# Patient Record
Sex: Male | Born: 1965 | Race: White | Hispanic: No | Marital: Single | Smoking: Current every day smoker
Health system: Southern US, Community
[De-identification: ages and names within clinical notes are randomized; demographics above are authoritative.]

## PROBLEM LIST (undated history)

## (undated) DIAGNOSIS — IMO0001 Reserved for inherently not codable concepts without codable children: Secondary | ICD-10-CM

## (undated) DIAGNOSIS — N429 Disorder of prostate, unspecified: Secondary | ICD-10-CM

## (undated) DIAGNOSIS — J449 Chronic obstructive pulmonary disease, unspecified: Secondary | ICD-10-CM

## (undated) DIAGNOSIS — M1990 Unspecified osteoarthritis, unspecified site: Secondary | ICD-10-CM

## (undated) DIAGNOSIS — I10 Essential (primary) hypertension: Secondary | ICD-10-CM

## (undated) DIAGNOSIS — F419 Anxiety disorder, unspecified: Secondary | ICD-10-CM

## (undated) DIAGNOSIS — F32A Depression, unspecified: Secondary | ICD-10-CM

## (undated) DIAGNOSIS — E039 Hypothyroidism, unspecified: Secondary | ICD-10-CM

## (undated) DIAGNOSIS — F329 Major depressive disorder, single episode, unspecified: Secondary | ICD-10-CM

## (undated) DIAGNOSIS — B1920 Unspecified viral hepatitis C without hepatic coma: Secondary | ICD-10-CM

## (undated) DIAGNOSIS — E7800 Pure hypercholesterolemia, unspecified: Secondary | ICD-10-CM

## (undated) DIAGNOSIS — I252 Old myocardial infarction: Secondary | ICD-10-CM

## (undated) HISTORY — PX: FINGER SURGERY: SHX640

## (undated) HISTORY — PX: HERNIA REPAIR: SHX51

## (undated) HISTORY — PX: KNEE SURGERY: SHX244

## (undated) HISTORY — PX: WRIST SURGERY: SHX841

---

## 2000-05-16 ENCOUNTER — Emergency Department (HOSPITAL_COMMUNITY): Admission: EM | Admit: 2000-05-16 | Discharge: 2000-05-16 | Payer: Self-pay | Admitting: Emergency Medicine

## 2000-12-12 ENCOUNTER — Emergency Department (HOSPITAL_COMMUNITY): Admission: EM | Admit: 2000-12-12 | Discharge: 2000-12-12 | Payer: Self-pay | Admitting: Emergency Medicine

## 2003-08-20 ENCOUNTER — Emergency Department (HOSPITAL_COMMUNITY): Admission: EM | Admit: 2003-08-20 | Discharge: 2003-08-20 | Payer: Self-pay | Admitting: Emergency Medicine

## 2003-08-27 ENCOUNTER — Emergency Department (HOSPITAL_COMMUNITY): Admission: EM | Admit: 2003-08-27 | Discharge: 2003-08-27 | Payer: Self-pay | Admitting: Emergency Medicine

## 2003-08-28 ENCOUNTER — Emergency Department (HOSPITAL_COMMUNITY): Admission: EM | Admit: 2003-08-28 | Discharge: 2003-08-29 | Payer: Self-pay | Admitting: Emergency Medicine

## 2003-08-30 ENCOUNTER — Emergency Department (HOSPITAL_COMMUNITY): Admission: EM | Admit: 2003-08-30 | Discharge: 2003-08-30 | Payer: Self-pay | Admitting: Emergency Medicine

## 2003-09-01 ENCOUNTER — Emergency Department (HOSPITAL_COMMUNITY): Admission: EM | Admit: 2003-09-01 | Discharge: 2003-09-02 | Payer: Self-pay | Admitting: *Deleted

## 2003-09-06 ENCOUNTER — Ambulatory Visit (HOSPITAL_COMMUNITY): Admission: RE | Admit: 2003-09-06 | Discharge: 2003-09-06 | Payer: Self-pay | Admitting: Family Medicine

## 2003-11-11 ENCOUNTER — Emergency Department (HOSPITAL_COMMUNITY): Admission: EM | Admit: 2003-11-11 | Discharge: 2003-11-12 | Payer: Self-pay | Admitting: *Deleted

## 2004-10-23 ENCOUNTER — Emergency Department (HOSPITAL_COMMUNITY): Admission: EM | Admit: 2004-10-23 | Discharge: 2004-10-23 | Payer: Self-pay | Admitting: Emergency Medicine

## 2004-10-25 ENCOUNTER — Emergency Department (HOSPITAL_COMMUNITY): Admission: EM | Admit: 2004-10-25 | Discharge: 2004-10-25 | Payer: Self-pay | Admitting: Emergency Medicine

## 2004-10-27 ENCOUNTER — Emergency Department (HOSPITAL_COMMUNITY): Admission: EM | Admit: 2004-10-27 | Discharge: 2004-10-27 | Payer: Self-pay | Admitting: Emergency Medicine

## 2004-10-28 ENCOUNTER — Ambulatory Visit (HOSPITAL_COMMUNITY): Admission: RE | Admit: 2004-10-28 | Discharge: 2004-10-28 | Payer: Self-pay | Admitting: Emergency Medicine

## 2004-10-31 ENCOUNTER — Emergency Department (HOSPITAL_COMMUNITY): Admission: EM | Admit: 2004-10-31 | Discharge: 2004-10-31 | Payer: Self-pay | Admitting: Emergency Medicine

## 2004-11-01 ENCOUNTER — Emergency Department (HOSPITAL_COMMUNITY): Admission: EM | Admit: 2004-11-01 | Discharge: 2004-11-01 | Payer: Self-pay | Admitting: Emergency Medicine

## 2004-11-05 ENCOUNTER — Emergency Department (HOSPITAL_COMMUNITY): Admission: EM | Admit: 2004-11-05 | Discharge: 2004-11-06 | Payer: Self-pay | Admitting: Emergency Medicine

## 2004-11-12 ENCOUNTER — Emergency Department (HOSPITAL_COMMUNITY): Admission: EM | Admit: 2004-11-12 | Discharge: 2004-11-12 | Payer: Self-pay | Admitting: Emergency Medicine

## 2004-12-07 ENCOUNTER — Emergency Department (HOSPITAL_COMMUNITY): Admission: EM | Admit: 2004-12-07 | Discharge: 2004-12-07 | Payer: Self-pay | Admitting: Emergency Medicine

## 2005-02-05 ENCOUNTER — Emergency Department (HOSPITAL_COMMUNITY): Admission: EM | Admit: 2005-02-05 | Discharge: 2005-02-05 | Payer: Self-pay | Admitting: Emergency Medicine

## 2005-03-08 ENCOUNTER — Emergency Department (HOSPITAL_COMMUNITY): Admission: EM | Admit: 2005-03-08 | Discharge: 2005-03-08 | Payer: Self-pay | Admitting: Emergency Medicine

## 2009-12-17 ENCOUNTER — Emergency Department (HOSPITAL_COMMUNITY)
Admission: EM | Admit: 2009-12-17 | Discharge: 2009-12-17 | Payer: Self-pay | Source: Home / Self Care | Admitting: Emergency Medicine

## 2010-03-31 LAB — DIFFERENTIAL
Eosinophils Absolute: 0.3 10*3/uL (ref 0.0–0.7)
Eosinophils Relative: 4 % (ref 0–5)
Lymphocytes Relative: 22 % (ref 12–46)
Lymphs Abs: 1.7 10*3/uL (ref 0.7–4.0)
Neutro Abs: 5.3 10*3/uL (ref 1.7–7.7)
Neutrophils Relative %: 68 % (ref 43–77)

## 2010-03-31 LAB — COMPREHENSIVE METABOLIC PANEL
ALT: 112 U/L — ABNORMAL HIGH (ref 0–53)
AST: 135 U/L — ABNORMAL HIGH (ref 0–37)
Alkaline Phosphatase: 232 U/L — ABNORMAL HIGH (ref 39–117)
BUN: 7 mg/dL (ref 6–23)
CO2: 24 mEq/L (ref 19–32)
Chloride: 105 mEq/L (ref 96–112)
Creatinine, Ser: 0.99 mg/dL (ref 0.4–1.5)
Sodium: 140 mEq/L (ref 135–145)
Total Bilirubin: 0.8 mg/dL (ref 0.3–1.2)
Total Protein: 7.6 g/dL (ref 6.0–8.3)

## 2010-03-31 LAB — CBC
HCT: 45.8 % (ref 39.0–52.0)
Hemoglobin: 16.7 g/dL (ref 13.0–17.0)
MCH: 31.5 pg (ref 26.0–34.0)
MCHC: 36.5 g/dL — ABNORMAL HIGH (ref 30.0–36.0)
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
## 2010-06-10 DIAGNOSIS — R079 Chest pain, unspecified: Secondary | ICD-10-CM

## 2010-06-10 DIAGNOSIS — R55 Syncope and collapse: Secondary | ICD-10-CM

## 2011-01-06 ENCOUNTER — Encounter: Payer: Self-pay | Admitting: Emergency Medicine

## 2011-01-06 ENCOUNTER — Emergency Department (HOSPITAL_COMMUNITY)
Admission: EM | Admit: 2011-01-06 | Discharge: 2011-01-06 | Disposition: A | Discharge status: Home / Self Care | Payer: Medicaid Other | Attending: Emergency Medicine | Admitting: Emergency Medicine

## 2011-01-06 DIAGNOSIS — T07XXXA Unspecified multiple injuries, initial encounter: Secondary | ICD-10-CM

## 2011-01-06 DIAGNOSIS — Z87828 Personal history of other (healed) physical injury and trauma: Secondary | ICD-10-CM | POA: Insufficient documentation

## 2011-01-06 DIAGNOSIS — M25559 Pain in unspecified hip: Secondary | ICD-10-CM | POA: Insufficient documentation

## 2011-01-06 DIAGNOSIS — M545 Low back pain, unspecified: Secondary | ICD-10-CM | POA: Insufficient documentation

## 2011-01-06 DIAGNOSIS — M25519 Pain in unspecified shoulder: Secondary | ICD-10-CM | POA: Insufficient documentation

## 2011-01-06 DIAGNOSIS — I10 Essential (primary) hypertension: Secondary | ICD-10-CM | POA: Insufficient documentation

## 2011-01-06 DIAGNOSIS — Z760 Encounter for issue of repeat prescription: Secondary | ICD-10-CM | POA: Insufficient documentation

## 2011-01-06 HISTORY — DX: Essential (primary) hypertension: I10

## 2011-01-06 HISTORY — DX: Unspecified viral hepatitis C without hepatic coma: B19.20

## 2011-01-06 LAB — ETHANOL: Alcohol, Ethyl (B): <11 mg/dL (ref 0–11)

## 2011-01-06 LAB — CBC
HCT: 38.8 % — ABNORMAL LOW (ref 39.0–52.0)
MCHC: 35.1 g/dL (ref 30.0–36.0)
Platelets: 185 10*3/uL (ref 150–400)
RBC: 4.57 MIL/uL (ref 4.22–5.81)

## 2011-01-06 LAB — BASIC METABOLIC PANEL
BUN: 8 mg/dL (ref 6–23)
CO2: 31 mEq/L (ref 19–32)
Calcium: 9.5 mg/dL (ref 8.4–10.5)
Creatinine, Ser: 0.62 mg/dL (ref 0.50–1.35)
GFR calc Af Amer: >90 mL/min (ref >90)

## 2011-01-06 MED ORDER — OXYCODONE-ACETAMINOPHEN 5-325 MG PO TABS: 1.0000 | ORAL_TABLET | Freq: Three times a day (TID) | ORAL | Status: AC | PRN
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
## 2011-01-08 NOTE — ED Provider Notes (Signed)
Medical screening examination/treatment/procedure(s) were performed by non-physician practitioner and as supervising physician I was immediately available for consultation/collaboration.  Nicoletta Dress. Colon Branch, MD 01/08/11 [phone]

## 2012-07-03 ENCOUNTER — Ambulatory Visit (HOSPITAL_COMMUNITY)
Admission: RE | Admit: 2012-07-03 | Discharge: 2012-07-03 | Disposition: A | Discharge status: Home / Self Care | Payer: Medicaid Other | Source: Ambulatory Visit | Attending: Family Medicine | Admitting: Family Medicine

## 2012-07-03 ENCOUNTER — Other Ambulatory Visit (HOSPITAL_COMMUNITY): Payer: Self-pay | Admitting: Family Medicine

## 2012-07-03 DIAGNOSIS — M545 Low back pain, unspecified: Secondary | ICD-10-CM | POA: Insufficient documentation

## 2012-07-03 DIAGNOSIS — IMO0002 Reserved for concepts with insufficient information to code with codable children: Secondary | ICD-10-CM

## 2012-07-03 DIAGNOSIS — Q762 Congenital spondylolisthesis: Secondary | ICD-10-CM | POA: Insufficient documentation

## 2014-02-07 ENCOUNTER — Ambulatory Visit (HOSPITAL_COMMUNITY)
Admission: RE | Admit: 2014-02-07 | Discharge: 2014-02-07 | Disposition: A | Discharge status: Home / Self Care | Payer: Medicaid Other | Source: Ambulatory Visit | Attending: Nurse Practitioner | Admitting: Nurse Practitioner

## 2014-02-07 ENCOUNTER — Other Ambulatory Visit (HOSPITAL_COMMUNITY): Payer: Self-pay | Admitting: Nurse Practitioner

## 2014-02-07 DIAGNOSIS — G8929 Other chronic pain: Secondary | ICD-10-CM | POA: Diagnosis not present

## 2014-02-07 DIAGNOSIS — M549 Dorsalgia, unspecified: Secondary | ICD-10-CM | POA: Insufficient documentation

## 2014-02-07 DIAGNOSIS — M545 Low back pain: Secondary | ICD-10-CM | POA: Insufficient documentation

## 2014-04-05 ENCOUNTER — Ambulatory Visit: Payer: Self-pay | Admitting: Surgery

## 2014-04-05 ENCOUNTER — Other Ambulatory Visit: Payer: Self-pay | Admitting: Surgery

## 2014-04-05 DIAGNOSIS — R2230 Localized swelling, mass and lump, unspecified upper limb: Secondary | ICD-10-CM

## 2014-04-05 NOTE — H&P (Signed)
Trevor [name] 04/05/2014 12:11 PM Location: [facility] Patient #: [id number] DOB: Jul 26, 1965 Single / Language: [place] / Race: White Male History of Present Illness Trevor Fus A. Trevor Priola MD; 04/05/2014 12:34 PM) Patient words: mass on left shoulder    pt sent for evaluation of left shoulder cyst/ mass. It has been present for many years and getting larger. Pt has some mild numbness left had but no weakness. Mass causes some shoulder pain.  The patient is a 49 year old male   Other Problems Trevor [name], Trevor [name]; 04/05/2014 12:11 PM) Arthritis Asthma Back Pain Chronic Obstructive Lung Disease Depression Diabetes Mellitus Enlarged Prostate High blood pressure Hypercholesterolemia  Past Surgical History Trevor [name], Trevor [name]; 04/05/2014 12:11 PM) Knee Surgery Left. Spinal Surgery - Neck  Allergies Trevor [name], Trevor [name]; 04/05/2014 12:12 PM) No Known Drug Allergies 04/05/2014  Medication History Trevor [name], Trevor [name]; 04/05/2014 12:14 PM) Nasal Moist (0.65% Solution, Nasal) Active. Hydrocodone-Acetaminophen (10-325MG  Tablet, Oral) Active. Pravastatin Sodium (  Tablet, Oral) Active. CloNIDine HCl (0.2MG  Tablet, Oral) Active. Metoprolol Succinate ER (  Tablet ER 24HR, Oral) Active. Levothyroxine Sodium ( Tablet, Oral) Active. MetFORMIN HCl (  Tablet, Oral) Active. Gabapentin (  Capsule, Oral) Active. Medications Reconciled  Social History Trevor [name], Trevor [name]; 04/05/2014 12:11 PM) Caffeine use Coffee, Tea. Illicit drug use Uses daily. Tobacco use Current every day smoker.  Family History Trevor [name], Trevor [name]; 04/05/2014 12:11 PM) Alcohol Abuse Brother, Father, Mother. Arthritis Father, Mother. Diabetes Mellitus Mother. Heart Disease Father, Mother. Hypertension Brother, Father, Mother.     Review of Systems Trevor [name] Trevor [name]; 04/05/2014 12:11 PM) General Present- Appetite Loss. Not Present- Chills, Fatigue, Fever, Night Sweats,  Weight Gain and Weight Loss. HEENT Present- Hearing Loss. Not Present- Earache, Hoarseness, Nose Bleed, Oral Ulcers, Ringing in the Ears, Seasonal Allergies, Sinus Pain, Sore Throat, Visual Disturbances, Wears glasses/contact lenses and Yellow Eyes. Musculoskeletal Present- Back Pain. Not Present- Joint Pain, Joint Stiffness, Muscle Pain, Muscle Weakness and Swelling of Extremities. Neurological Present- Trouble walking. Not Present- Decreased Memory, Fainting, Headaches, Numbness, Seizures, Tingling, Tremor and Weakness. Psychiatric Present- Bipolar and Depression. Not Present- Anxiety, Change in Sleep Pattern, Fearful and Frequent crying.  Vitals Trevor [name] Trevor [name]; 04/05/2014 12:15 PM) 04/05/2014 12:14 PM Weight: 237 lb Height: 67in Body Surface Area: 2.25 m Body Mass Index: 37.12 kg/m Temp.: 97.52F(Temporal)  Pulse: 80 (Regular)  Resp.: 17 (Unlabored)  BP: 140/76 (Sitting, Left Arm, Standard)     Physical Exam (Trevor Luft A. Trevor Bacha MD; 04/05/2014 12:36 PM)  General Mental Status-Alert. General Appearance-Consistent with stated age. Hydration-Well hydrated. Voice-Normal.  Integumentary Note: 6 cm mass left shoulder semixed but soft lobulated.   Chest and Lung Exam Chest and lung exam reveals -quiet, even and easy respiratory effort with no use of accessory muscles and on auscultation, normal breath sounds, no adventitious sounds and normal vocal resonance. Inspection Chest Wall - Normal. Back - normal.  Cardiovascular Cardiovascular examination reveals -normal heart sounds, regular rate and rhythm with no murmurs and normal pedal pulses bilaterally.  Neurologic Note: left hand ROM normalexcept has weakness with extension of left wrist 3 /5. intrinsic muscles intact. No sensry defects noted. Good radial pulse.   Musculoskeletal Normal Exam - Left-Upper Extremity Strength Normal and Lower Extremity Strength Normal. Normal Exam - Right-Upper  Extremity Strength Normal and Lower Extremity Strength Normal.    Assessment & Plan ([name] MD; 04/05/2014 12:38 PM)  MASS OF SKIN OF LEFT SHOULDER (782.2  R22.32) Impression: large semifixed mass will need MRI to determine if the joint space or  nerves involved. Follow up after once anatomy better determined.  risk of resection include bleeding, infection, joint injury, nerve injury wit functional loss, blood vessel injury and the need for more surgery.  Current Plans Pt Education - POST OP

## 2014-04-25 ENCOUNTER — Ambulatory Visit (HOSPITAL_COMMUNITY)
Admission: RE | Admit: 2014-04-25 | Discharge: 2014-04-25 | Disposition: A | Discharge status: Home / Self Care | Payer: Medicaid Other | Source: Ambulatory Visit | Attending: Nurse Practitioner | Admitting: Nurse Practitioner

## 2014-04-25 ENCOUNTER — Other Ambulatory Visit (HOSPITAL_COMMUNITY): Payer: Self-pay | Admitting: Nurse Practitioner

## 2014-04-25 DIAGNOSIS — R0602 Shortness of breath: Secondary | ICD-10-CM

## 2014-05-09 ENCOUNTER — Other Ambulatory Visit (HOSPITAL_COMMUNITY): Payer: Self-pay | Admitting: Nurse Practitioner

## 2014-05-09 ENCOUNTER — Ambulatory Visit (HOSPITAL_COMMUNITY)
Admission: RE | Admit: 2014-05-09 | Discharge: 2014-05-09 | Disposition: A | Discharge status: Home / Self Care | Payer: Medicaid Other | Source: Ambulatory Visit | Attending: Nurse Practitioner | Admitting: Nurse Practitioner

## 2014-05-09 DIAGNOSIS — M25552 Pain in left hip: Secondary | ICD-10-CM | POA: Diagnosis present

## 2014-06-12 ENCOUNTER — Ambulatory Visit (HOSPITAL_COMMUNITY): Payer: MEDICAID | Attending: Nurse Practitioner

## 2014-06-18 ENCOUNTER — Ambulatory Visit (INDEPENDENT_AMBULATORY_CARE_PROVIDER_SITE_OTHER): Payer: Medicaid Other | Admitting: Urology

## 2014-06-18 DIAGNOSIS — N529 Male erectile dysfunction, unspecified: Secondary | ICD-10-CM | POA: Diagnosis not present

## 2014-06-18 DIAGNOSIS — N400 Enlarged prostate without lower urinary tract symptoms: Secondary | ICD-10-CM | POA: Diagnosis not present

## 2014-06-18 DIAGNOSIS — R3919 Other difficulties with micturition: Secondary | ICD-10-CM | POA: Diagnosis not present

## 2014-06-19 ENCOUNTER — Emergency Department (HOSPITAL_COMMUNITY): Payer: Medicaid Other

## 2014-06-19 ENCOUNTER — Emergency Department (HOSPITAL_COMMUNITY)
Admission: EM | Admit: 2014-06-19 | Discharge: 2014-06-19 | Disposition: A | Discharge status: Home / Self Care | Payer: Medicaid Other | Attending: Emergency Medicine | Admitting: Emergency Medicine

## 2014-06-19 ENCOUNTER — Encounter (HOSPITAL_COMMUNITY): Payer: Self-pay | Admitting: *Deleted

## 2014-06-19 DIAGNOSIS — E860 Dehydration: Secondary | ICD-10-CM

## 2014-06-19 DIAGNOSIS — E780 Pure hypercholesterolemia: Secondary | ICD-10-CM | POA: Diagnosis not present

## 2014-06-19 DIAGNOSIS — I252 Old myocardial infarction: Secondary | ICD-10-CM | POA: Diagnosis not present

## 2014-06-19 DIAGNOSIS — Z720 Tobacco use: Secondary | ICD-10-CM | POA: Diagnosis not present

## 2014-06-19 DIAGNOSIS — R55 Syncope and collapse: Secondary | ICD-10-CM | POA: Diagnosis present

## 2014-06-19 DIAGNOSIS — R5383 Other fatigue: Secondary | ICD-10-CM | POA: Diagnosis not present

## 2014-06-19 DIAGNOSIS — Z8619 Personal history of other infectious and parasitic diseases: Secondary | ICD-10-CM | POA: Diagnosis not present

## 2014-06-19 DIAGNOSIS — Z79899 Other long term (current) drug therapy: Secondary | ICD-10-CM | POA: Insufficient documentation

## 2014-06-19 DIAGNOSIS — E119 Type 2 diabetes mellitus without complications: Secondary | ICD-10-CM | POA: Insufficient documentation

## 2014-06-19 DIAGNOSIS — I10 Essential (primary) hypertension: Secondary | ICD-10-CM | POA: Insufficient documentation

## 2014-06-19 HISTORY — DX: Old myocardial infarction: I25.2

## 2014-06-19 HISTORY — DX: Pure hypercholesterolemia, unspecified: E78.00

## 2014-06-19 LAB — CBC WITH DIFFERENTIAL/PLATELET
Basophils Absolute: 0.1 10*3/uL (ref 0.0–0.1)
Basophils Relative: 1 % (ref 0–1)
EOS ABS: 0.3 10*3/uL (ref 0.0–0.7)
EOS PCT: 3 % (ref 0–5)
HCT: 40.6 % (ref 39.0–52.0)
Hemoglobin: 13.8 g/dL (ref 13.0–17.0)
Lymphocytes Relative: 23 % (ref 12–46)
Lymphs Abs: 2.6 10*3/uL (ref 0.7–4.0)
MCH: 29.2 pg (ref 26.0–34.0)
MCHC: 34 g/dL (ref 30.0–36.0)
MCV: 85.8 fL (ref 78.0–100.0)
MONOS PCT: 7 % (ref 3–12)
Monocytes Absolute: 0.8 10*3/uL (ref 0.1–1.0)
NEUTROS PCT: 66 % (ref 43–77)
Neutro Abs: 7.4 10*3/uL (ref 1.7–7.7)
Platelets: 272 10*3/uL (ref 150–400)
RBC: 4.73 MIL/uL (ref 4.22–5.81)
RDW: 13 % (ref 11.5–15.5)
WBC: 11.1 10*3/uL — AB (ref 4.0–10.5)

## 2014-06-19 LAB — BASIC METABOLIC PANEL
ANION GAP: 10 (ref 5–15)
BUN: 21 mg/dL — AB (ref 6–20)
CALCIUM: 8.3 mg/dL — AB (ref 8.9–10.3)
CO2: 22 mmol/L (ref 22–32)
Chloride: 105 mmol/L (ref 101–111)
Creatinine, Ser: 1.69 mg/dL — ABNORMAL HIGH (ref 0.61–1.24)
GFR calc Af Amer: 53 mL/min — ABNORMAL LOW (ref >60)
GFR calc non Af Amer: 46 mL/min — ABNORMAL LOW (ref >60)
GLUCOSE: 114 mg/dL — AB (ref 65–99)
Potassium: 3.7 mmol/L (ref 3.5–5.1)
Sodium: 137 mmol/L (ref 135–145)

## 2014-06-19 LAB — URINALYSIS, ROUTINE W REFLEX MICROSCOPIC
Bilirubin Urine: NEGATIVE
GLUCOSE, UA: NEGATIVE mg/dL
HGB URINE DIPSTICK: NEGATIVE
Ketones, ur: NEGATIVE mg/dL
Leukocytes, UA: NEGATIVE
Nitrite: NEGATIVE
Protein, ur: NEGATIVE mg/dL
Specific Gravity, Urine: 1.025 (ref 1.005–1.030)
Urobilinogen, UA: 0.2 mg/dL (ref 0.0–1.0)
pH: 5 (ref 5.0–8.0)

## 2014-06-19 LAB — I-STAT CG4 LACTIC ACID, ED: Lactic Acid, Venous: 1.44 mmol/L (ref 0.5–2.0)

## 2014-06-19 LAB — TROPONIN I: Troponin I: <0.03 ng/mL (ref <0.031)

## 2014-06-19 MED ORDER — SODIUM CHLORIDE 0.9 % IV BOLUS (SEPSIS)
1000.0000 mL | Freq: Once | INTRAVENOUS | Status: AC
  Administered 2014-06-19: 1000 mL via INTRAVENOUS

## 2014-06-19 MED ORDER — SODIUM CHLORIDE 0.9 % IV BOLUS (SEPSIS): 500.0000 mL | Freq: Once | INTRAVENOUS | Status: DC

## 2014-06-19 NOTE — ED Provider Notes (Signed)
CSN: [id number]     Arrival date & time 06/19/14  1524 History   First MD Initiated Contact with Patient 06/19/14 1532     Chief Complaint  Patient presents with  . Loss of Consciousness     (Consider location/radiation/quality/duration/timing/severity/associated sxs/prior Treatment) Patient is a 49 y.o. male presenting with syncope. The history is provided by the patient (the pt states he felt dizzy and passed out.   he feels better now).  Loss of Consciousness Episode history:  Single Most recent episode:  Today Timing:  Rare Progression:  Resolved Chronicity:  New Context: not blood draw   Associated symptoms: no chest pain, no headaches and no seizures     Past Medical History  Diagnosis Date  . Diabetes mellitus   . Hypertension   . Hepatitis C   . High cholesterol   . MI, old    Past Surgical History  Procedure Laterality Date  . Wrist surgery    . Knee surgery    . Hernia repair     No family history on file. History  Substance Use Topics  . Smoking status: Current Every Day Smoker -- 0.50 packs/day  . Smokeless tobacco: Not on file  . Alcohol Use: No    Review of Systems  Constitutional: Positive for fatigue. Negative for appetite change.  HENT: Negative for congestion, ear discharge and sinus pressure.   Eyes: Negative for discharge.  Respiratory: Negative for cough.   Cardiovascular: Positive for syncope. Negative for chest pain.  Gastrointestinal: Negative for abdominal pain and diarrhea.  Genitourinary: Negative for frequency and hematuria.  Musculoskeletal: Negative for back pain.  Skin: Negative for rash.  Neurological: Negative for seizures and headaches.  Psychiatric/Behavioral: Negative for hallucinations.      Allergies  Ivp dye  Home Medications   Prior to Admission medications   Medication Sig Start Date End Date Taking? Authorizing Provider  atorvastatin (LIPITOR) 40 MG tablet Take 40 mg by mouth daily.   Yes Historical Provider,  MD  FLUoxetine (PROZAC) 20 MG capsule Take 20 mg by mouth every evening.   Yes Historical Provider, MD  gabapentin (NEURONTIN) 300 MG capsule Take 300 mg by mouth 3 (three) times daily.   Yes Historical Provider, MD  hydrOXYzine (ATARAX/VISTARIL) 25 MG tablet Take 25 mg by mouth at bedtime.   Yes Historical Provider, MD  levothyroxine (SYNTHROID, LEVOTHROID) 25 MCG tablet Take 25 mcg by mouth daily before breakfast.   Yes Historical Provider, MD  lisinopril (PRINIVIL,ZESTRIL) 20 MG tablet Take 20 mg by mouth daily.   Yes Historical Provider, MD  metFORMIN (GLUCOPHAGE-XR) 500 MG 24 hr tablet Take 500 mg by mouth every evening.   Yes Historical Provider, MD  metoprolol succinate (TOPROL-XL) 50 MG 24 hr tablet Take 50 mg by mouth daily. Take with or immediately following a meal.   Yes Historical Provider, MD  sulindac (CLINORIL) 150 MG tablet Take 150 mg by mouth 2 (two) times daily.   Yes Historical Provider, MD   BP 127/90 mmHg  Pulse 58  Temp(Src) 97.5 F (36.4 C)  Resp 18  Ht 5' 7.5" (1.715 m)  Wt 250 lb (113.399 kg)  BMI 38.56 kg/m2  SpO2 100% Physical Exam  Constitutional: He is oriented to person, place, and time. He appears well-developed.  HENT:  Head: Normocephalic.  Eyes: Conjunctivae and EOM are normal. No scleral icterus.  Neck: Neck supple. No thyromegaly present.  Cardiovascular: Normal rate and regular rhythm.  Exam reveals no gallop and no friction  rub.   No murmur heard. Pulmonary/Chest: No stridor. He has no wheezes. He has no rales. He exhibits no tenderness.  Abdominal: He exhibits no distension. There is no tenderness. There is no rebound.  Musculoskeletal: Normal range of motion. He exhibits no edema.  Lymphadenopathy:    He has no cervical adenopathy.  Neurological: He is oriented to person, place, and time. He exhibits normal muscle tone. Coordination normal.  Skin: No rash noted. No erythema.  Psychiatric: He has a normal mood and affect. His behavior is  normal.    ED Course  Procedures (including critical care time) Labs Review Labs Reviewed  CBC WITH DIFFERENTIAL/PLATELET - Abnormal; Notable for the following:    WBC 11.1 (*)    All other components within normal limits  BASIC METABOLIC PANEL - Abnormal; Notable for the following:    Glucose, Bld 114 (*)    BUN 21 (*)    Creatinine, Ser 1.69 (*)    Calcium 8.3 (*)    GFR calc non Af Amer 46 (*)    GFR calc Af Amer 53 (*)    All other components within normal limits  TROPONIN I  URINALYSIS, ROUTINE W REFLEX MICROSCOPIC (NOT AT [facility])  I-STAT CG4 LACTIC ACID, ED    Imaging Review Ct Head Wo Contrast  06/19/2014   CLINICAL DATA:  49 year old male with dizziness and syncope  EXAM: CT HEAD WITHOUT CONTRAST  TECHNIQUE: Contiguous axial images were obtained from the base of the skull through the vertex without intravenous contrast.  COMPARISON:  Prior CT scan of the head 11/14/2010  FINDINGS: Negative for acute intracranial hemorrhage, acute infarction, mass, mass effect, hydrocephalus or midline shift. Gray-white differentiation is preserved throughout. 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 Remote lacunar infarcts in the bilateral basal ganglia. The globes and orbits are symmetric bilaterally. No focal soft tissue abnormality. Partial right mastoid effusion. The left mastoid air cells and visualized paranasal sinuses are otherwise well aerated. No focal calvarial abnormality. Atherosclerotic calcification present in both cavernous carotid arteries.  IMPRESSION: 1. No acute intracranial abnormality. 2. Partial right mastoid effusion. 3. 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   Electronically Signed   By: Malachy MoanHeath  McCullough M.D.   On: 06/19/2014 16:46   Dg Chest Portable 1 View  06/19/2014   CLINICAL DATA:  Syncopal episode, dizziness, low back  pain, chronic shortness of breath, smokers cough, history diabetes, hypertension, MI, hepatitis-C  EXAM: PORTABLE CHEST - 1 VIEW  COMPARISON:  Portable exam 1546 hours compared to 04/25/2014  FINDINGS: Normal heart size, mediastinal contours, and pulmonary vascularity.  Lungs clear.  No pleural effusion or pneumothorax.  No acute osseous findings.  IMPRESSION: No acute abnormalities.   Electronically Signed   By: Ulyses SouthwardMark  Boles M.D.   On: 06/19/2014 15:56     EKG Interpretation None      MDM   Final diagnoses:  Dehydration    syncope    Bethann BerkshireJoseph Tasheba Henson, MD 06/19/14 1851

## 2014-06-19 NOTE — Discharge Instructions (Signed)
Drink plenty of fluids and follow up with your md next week °

## 2014-06-19 NOTE — ED Notes (Signed)
cbg 133 NSR on monitor reported by [facility], still c/o dizziness, c/o lower back pain with hx of lower back pain

## 2014-06-24 ENCOUNTER — Ambulatory Visit (HOSPITAL_COMMUNITY): Admission: RE | Admit: 2014-06-24 | Payer: Medicaid Other | Source: Ambulatory Visit

## 2014-06-28 ENCOUNTER — Other Ambulatory Visit: Payer: Self-pay | Admitting: Physical Medicine and Rehabilitation

## 2014-06-28 DIAGNOSIS — M48061 Spinal stenosis, lumbar region without neurogenic claudication: Secondary | ICD-10-CM

## 2014-07-05 ENCOUNTER — Ambulatory Visit
Admission: RE | Admit: 2014-07-05 | Discharge: 2014-07-05 | Disposition: A | Discharge status: Home / Self Care | Payer: Medicaid Other | Source: Ambulatory Visit | Attending: Physical Medicine and Rehabilitation | Admitting: Physical Medicine and Rehabilitation

## 2014-07-05 DIAGNOSIS — M48061 Spinal stenosis, lumbar region without neurogenic claudication: Secondary | ICD-10-CM

## 2014-07-05 MED ORDER — METHYLPREDNISOLONE ACETATE 40 MG/ML INJ SUSP (RADIOLOG
120.0000 mg | Freq: Once | INTRAMUSCULAR | Status: AC
  Administered 2014-07-05: 120 mg via EPIDURAL

## 2014-07-05 MED ORDER — IOHEXOL 180 MG/ML  SOLN
1.0000 mL | Freq: Once | INTRAMUSCULAR | Status: AC | PRN
  Administered 2014-07-05: 1 mL via EPIDURAL

## 2014-07-05 NOTE — Discharge Instructions (Signed)

## 2014-07-18 ENCOUNTER — Ambulatory Visit (INDEPENDENT_AMBULATORY_CARE_PROVIDER_SITE_OTHER): Payer: Medicaid Other | Admitting: Cardiology

## 2014-07-18 ENCOUNTER — Encounter: Payer: Self-pay | Admitting: *Deleted

## 2014-07-18 VITALS — BP 156/100 | HR 67 | Ht 67.0 in | Wt 247.0 lb

## 2014-07-18 DIAGNOSIS — Z136 Encounter for screening for cardiovascular disorders: Secondary | ICD-10-CM | POA: Diagnosis not present

## 2014-07-18 DIAGNOSIS — R55 Syncope and collapse: Secondary | ICD-10-CM | POA: Diagnosis not present

## 2014-07-18 NOTE — Patient Instructions (Signed)
Your physician recommends that you schedule a follow-up appointment in: 1 month with Dr. Branch  Your physician recommends that you continue on your current medications as directed. Please refer to the Current Medication list given to you today.  Thank you for choosing [facility]!!    

## 2014-07-18 NOTE — Progress Notes (Signed)
Clinical Summary Trevor [name] is a 49 y.o.male seen today as a new patient for the following medical problems.  1. Syncope - first episode 2.5 years while driving, blacked out. He does not recall the details. Does not recall a prodrome. Did not have any recurrent symptoms until 3-4 weeks ago.  - June 19, 2014 seen at [facility] ER. Episode while working on scooter at home. Stood up to walk in house and passed out. Felt lightheaded. No palpitations, no chest pain. Seen in [facility] ER, labs suggested dehydration (Cr 1.7 up from 0.6), BUN 21. EKG reports abnormal lateral T waves however there is arm lead reversal as the cause. Repeat episode 1 week later, but does not remember details. Seen in [facility] ER yesterday for mechanical fall, he specifically denies syncope.  - drinks 1 gallon tea, 20 oz bottles of water x 3.  - recent increase in pain meds, currently taking hydrocodone/ tylenol, hydrocodone dose is 7.5mg       Past Medical History  Diagnosis Date  . Diabetes mellitus   . Hypertension   . Hepatitis C   . High cholesterol   . MI, old      No Known Allergies   Current Outpatient Prescriptions  Medication Sig Dispense Refill  . atorvastatin (LIPITOR) 40 MG tablet Take 40 mg by mouth daily.    Marland Kitchen FLUoxetine (PROZAC) 20 MG capsule Take 20 mg by mouth every evening.    . gabapentin (NEURONTIN) 300 MG capsule Take 300 mg by mouth 3 (three) times daily.    . hydrOXYzine (ATARAX/VISTARIL) 25 MG tablet Take 25 mg by mouth at bedtime.    Marland Kitchen levothyroxine (SYNTHROID, LEVOTHROID) 25 MCG tablet Take 25 mcg by mouth daily before breakfast.    . lisinopril (PRINIVIL,ZESTRIL) 20 MG tablet Take 20 mg by mouth daily.    . metFORMIN (GLUCOPHAGE-XR) 500 MG 24 hr tablet Take 500 mg by mouth every evening.    . metoprolol succinate (TOPROL-XL) 50 MG 24 hr tablet Take 50 mg by mouth daily. Take with or immediately following a meal.    . sulindac (CLINORIL) 150 MG tablet Take 150 mg by mouth 2  (two) times daily.     No current facility-administered medications for this visit.     Past Surgical History  Procedure Laterality Date  . Wrist surgery    . Knee surgery    . Hernia repair       No Known Allergies    Family History  Problem Relation Age of Onset  . Alcohol abuse Father   . Alcohol abuse Mother   . Alcohol abuse Brother   . Arthritis Father   . Arthritis Mother   . Diabetes Mother   . Heart disease Father   . Heart disease Mother   . Hypertension Father   . Hypertension Mother   . Hypertension Brother      Social History Trevor [name] reports that he has been smoking.  He does not have any smokeless tobacco history on file. Trevor [name] reports that he does not drink alcohol.   Review of Systems CONSTITUTIONAL: No weight loss, fever, chills, weakness or fatigue.  HEENT: Eyes: No visual loss, blurred vision, double vision or yellow sclerae.No hearing loss, sneezing, congestion, runny nose or sore throat.  SKIN: No rash or itching.  CARDIOVASCULAR: per HPI  RESPIRATORY: No shortness of breath, cough or sputum.  GASTROINTESTINAL: No anorexia, nausea, vomiting or diarrhea. No abdominal pain or blood.  GENITOURINARY:  No burning on urination, no polyuria NEUROLOGICAL: No headache, dizziness, syncope, paralysis, ataxia, numbness or tingling in the extremities. No change in bowel or bladder control.  MUSCULOSKELETAL: chronic joint pain LYMPHATICS: No enlarged nodes. No history of splenectomy.  PSYCHIATRIC: No history of depression or anxiety.  ENDOCRINOLOGIC: No reports of sweating, cold or heat intolerance. No polyuria or polydipsia.  Marland Kitchen.   Physical Examination Filed Vitals:   07/18/14 0943  BP: 156/100  Pulse: 67   Filed Vitals:   07/18/14 0921  Height: 5\' 7"  (1.702 m)  Weight: 247 lb (112.038 kg)    Gen: resting comfortably, no acute distress HEENT: no scleral icterus, pupils equal round and reactive, no palptable cervical adenopathy,  CV:  RRR, no m/r/g, NO JVD Resp: Clear to auscultation bilaterally GI: abdomen is soft, non-tender, non-distended, normal bowel sounds, no hepatosplenomegaly MSK: extremities are warm, no edema.  Skin: warm, no rash Neuro:  no focal deficits. Pinpoint pupils Psych: appropriate affect   Diagnostic Studies Clinic EKG: SR    Assessment and Plan  1. Syncope - approximately 3 episodes over the last 2.5 years. Most recently episode occurred after standing up from working on a motor scooter at home. Significantly elevated Cr suggests he was dehdydrate at the time. He drinks a gallon of tea of day, explained caffeine is a diuretic and that he is actually dehydrating him. His pupils are pinpoint and he has recent increase in his hydrocodone, I suspect some opiate induced vasodilation may have contributed to the episode - encouraged increased oral intake of noncaffeinated beverages. If symptoms persist will consider more extensive cardiac workup at that time.    F/u 1 month   Antoine PocheJonathan F. Danner Paulding, M.D.

## 2014-07-23 ENCOUNTER — Ambulatory Visit: Payer: Medicaid Other | Admitting: Urology

## 2014-08-05 ENCOUNTER — Observation Stay (HOSPITAL_COMMUNITY): Payer: Medicaid Other

## 2014-08-05 ENCOUNTER — Inpatient Hospital Stay (HOSPITAL_COMMUNITY)
Admission: EM | Admit: 2014-08-05 | Discharge: 2014-08-13 | DRG: 329 | Disposition: A | Discharge status: Home Health | Payer: Medicaid Other | Attending: Surgery | Admitting: Surgery

## 2014-08-05 ENCOUNTER — Observation Stay (HOSPITAL_COMMUNITY): Payer: Medicaid Other | Admitting: Anesthesiology

## 2014-08-05 ENCOUNTER — Encounter (HOSPITAL_COMMUNITY): Payer: Self-pay | Admitting: Radiology

## 2014-08-05 ENCOUNTER — Encounter (HOSPITAL_COMMUNITY): Admission: EM | Disposition: A | Discharge status: Home Health | Payer: Self-pay | Source: Home / Self Care

## 2014-08-05 DIAGNOSIS — K567 Ileus, unspecified: Secondary | ICD-10-CM | POA: Diagnosis not present

## 2014-08-05 DIAGNOSIS — S36439A Laceration of unspecified part of small intestine, initial encounter: Principal | ICD-10-CM | POA: Diagnosis present

## 2014-08-05 DIAGNOSIS — E876 Hypokalemia: Secondary | ICD-10-CM | POA: Diagnosis not present

## 2014-08-05 DIAGNOSIS — I252 Old myocardial infarction: Secondary | ICD-10-CM

## 2014-08-05 DIAGNOSIS — K9189 Other postprocedural complications and disorders of digestive system: Secondary | ICD-10-CM

## 2014-08-05 DIAGNOSIS — B1920 Unspecified viral hepatitis C without hepatic coma: Secondary | ICD-10-CM | POA: Diagnosis present

## 2014-08-05 DIAGNOSIS — S32009A Unspecified fracture of unspecified lumbar vertebra, initial encounter for closed fracture: Secondary | ICD-10-CM | POA: Diagnosis present

## 2014-08-05 DIAGNOSIS — Z79899 Other long term (current) drug therapy: Secondary | ICD-10-CM

## 2014-08-05 DIAGNOSIS — W132XXA Fall from, out of or through roof, initial encounter: Secondary | ICD-10-CM | POA: Diagnosis present

## 2014-08-05 DIAGNOSIS — F17210 Nicotine dependence, cigarettes, uncomplicated: Secondary | ICD-10-CM | POA: Diagnosis present

## 2014-08-05 DIAGNOSIS — Z0189 Encounter for other specified special examinations: Secondary | ICD-10-CM

## 2014-08-05 DIAGNOSIS — W19XXXA Unspecified fall, initial encounter: Secondary | ICD-10-CM | POA: Diagnosis present

## 2014-08-05 DIAGNOSIS — K668 Other specified disorders of peritoneum: Secondary | ICD-10-CM

## 2014-08-05 DIAGNOSIS — S36409A Unspecified injury of unspecified part of small intestine, initial encounter: Secondary | ICD-10-CM | POA: Diagnosis present

## 2014-08-05 DIAGNOSIS — K651 Peritoneal abscess: Secondary | ICD-10-CM | POA: Diagnosis present

## 2014-08-05 DIAGNOSIS — I10 Essential (primary) hypertension: Secondary | ICD-10-CM | POA: Diagnosis present

## 2014-08-05 DIAGNOSIS — Z8249 Family history of ischemic heart disease and other diseases of the circulatory system: Secondary | ICD-10-CM

## 2014-08-05 DIAGNOSIS — S32019A Unspecified fracture of first lumbar vertebra, initial encounter for closed fracture: Secondary | ICD-10-CM | POA: Diagnosis present

## 2014-08-05 DIAGNOSIS — E780 Pure hypercholesterolemia: Secondary | ICD-10-CM | POA: Diagnosis present

## 2014-08-05 DIAGNOSIS — E119 Type 2 diabetes mellitus without complications: Secondary | ICD-10-CM | POA: Diagnosis present

## 2014-08-05 HISTORY — PX: LAPAROTOMY: SHX154

## 2014-08-05 LAB — GLUCOSE, CAPILLARY: Glucose-Capillary: 126 mg/dL — ABNORMAL HIGH (ref 65–99)

## 2014-08-05 SURGERY — LAPAROTOMY, EXPLORATORY
Anesthesia: General | Site: Abdomen

## 2014-08-05 MED ORDER — ALBUMIN HUMAN 5 % IV SOLN
INTRAVENOUS | Status: DC | PRN
  Administered 2014-08-05: 22:00:00 via INTRAVENOUS

## 2014-08-05 MED ORDER — OXYCODONE HCL 5 MG PO TABS: 5.0000 mg | ORAL_TABLET | Freq: Once | ORAL | Status: AC | PRN

## 2014-08-05 MED ORDER — PROPOFOL 10 MG/ML IV BOLUS
INTRAVENOUS | Status: AC
  Filled 2014-08-05: qty 20

## 2014-08-05 MED ORDER — 0.9 % SODIUM CHLORIDE (POUR BTL) OPTIME
TOPICAL | Status: DC | PRN
  Administered 2014-08-05: 4000 mL

## 2014-08-05 MED ORDER — GLYCOPYRROLATE 0.2 MG/ML IJ SOLN
INTRAMUSCULAR | Status: DC | PRN
  Administered 2014-08-05: 0.4 mg via INTRAVENOUS

## 2014-08-05 MED ORDER — GLYCOPYRROLATE 0.2 MG/ML IJ SOLN
INTRAMUSCULAR | Status: AC
  Filled 2014-08-05: qty 2

## 2014-08-05 MED ORDER — ONDANSETRON HCL 4 MG/2ML IJ SOLN
INTRAMUSCULAR | Status: DC | PRN
  Administered 2014-08-05: 4 mg via INTRAVENOUS

## 2014-08-05 MED ORDER — FENTANYL CITRATE (PF) 250 MCG/5ML IJ SOLN
INTRAMUSCULAR | Status: AC
  Filled 2014-08-05: qty 5

## 2014-08-05 MED ORDER — LIDOCAINE HCL (CARDIAC) 20 MG/ML IV SOLN
INTRAVENOUS | Status: DC | PRN
  Administered 2014-08-05: 40 mg via INTRAVENOUS

## 2014-08-05 MED ORDER — MIDAZOLAM HCL 2 MG/2ML IJ SOLN
INTRAMUSCULAR | Status: AC
  Filled 2014-08-05: qty 2

## 2014-08-05 MED ORDER — DIPHENHYDRAMINE HCL 50 MG/ML IJ SOLN
12.5000 mg | Freq: Four times a day (QID) | INTRAMUSCULAR | Status: DC | PRN
  Filled 2014-08-05: qty 0.25

## 2014-08-05 MED ORDER — FENTANYL CITRATE (PF) 100 MCG/2ML IJ SOLN
INTRAMUSCULAR | Status: DC | PRN
  Administered 2014-08-05: 100 ug via INTRAVENOUS
  Administered 2014-08-05: 150 ug via INTRAVENOUS
  Administered 2014-08-05: 100 ug via INTRAVENOUS

## 2014-08-05 MED ORDER — ONDANSETRON HCL 4 MG/2ML IJ SOLN: 4.0000 mg | Freq: Four times a day (QID) | INTRAMUSCULAR | Status: DC | PRN

## 2014-08-05 MED ORDER — SUCCINYLCHOLINE CHLORIDE 20 MG/ML IJ SOLN
INTRAMUSCULAR | Status: DC | PRN
  Administered 2014-08-05: 140 mg via INTRAVENOUS

## 2014-08-05 MED ORDER — DIPHENHYDRAMINE HCL 12.5 MG/5ML PO ELIX
12.5000 mg | ORAL_SOLUTION | Freq: Four times a day (QID) | ORAL | Status: DC | PRN
  Administered 2014-08-11: 12.5 mg via ORAL
  Filled 2014-08-05: qty 10
  Filled 2014-08-05: qty 5

## 2014-08-05 MED ORDER — NEOSTIGMINE METHYLSULFATE 10 MG/10ML IV SOLN
INTRAVENOUS | Status: DC | PRN
  Administered 2014-08-05: 4 mg via INTRAVENOUS

## 2014-08-05 MED ORDER — PROPOFOL 10 MG/ML IV BOLUS
INTRAVENOUS | Status: DC | PRN
  Administered 2014-08-05: 200 mg via INTRAVENOUS

## 2014-08-05 MED ORDER — LABETALOL HCL 5 MG/ML IV SOLN
INTRAVENOUS | Status: AC
  Filled 2014-08-05: qty 4

## 2014-08-05 MED ORDER — SODIUM CHLORIDE 0.9 % IV BOLUS (SEPSIS)
1000.0000 mL | Freq: Once | INTRAVENOUS | Status: AC
  Administered 2014-08-05: 1000 mL via INTRAVENOUS

## 2014-08-05 MED ORDER — HYDROMORPHONE 0.3 MG/ML IV SOLN
INTRAVENOUS | Status: AC
  Filled 2014-08-05: qty 25

## 2014-08-05 MED ORDER — LIDOCAINE HCL (CARDIAC) 20 MG/ML IV SOLN
INTRAVENOUS | Status: AC
  Filled 2014-08-05: qty 5

## 2014-08-05 MED ORDER — OXYCODONE HCL 5 MG/5ML PO SOLN: 5.0000 mg | Freq: Once | ORAL | Status: AC | PRN

## 2014-08-05 MED ORDER — HYDROMORPHONE HCL 1 MG/ML IJ SOLN
INTRAMUSCULAR | Status: AC
  Filled 2014-08-05: qty 1

## 2014-08-05 MED ORDER — ROCURONIUM BROMIDE 50 MG/5ML IV SOLN
INTRAVENOUS | Status: AC
  Filled 2014-08-05: qty 1

## 2014-08-05 MED ORDER — ONDANSETRON HCL 4 MG/2ML IJ SOLN
INTRAMUSCULAR | Status: AC
  Filled 2014-08-05: qty 2

## 2014-08-05 MED ORDER — MORPHINE SULFATE 4 MG/ML IJ SOLN
4.0000 mg | Freq: Once | INTRAMUSCULAR | Status: AC
  Administered 2014-08-05: 4 mg via INTRAVENOUS
  Filled 2014-08-05: qty 1

## 2014-08-05 MED ORDER — SODIUM CHLORIDE 0.9 % IV SOLN
INTRAVENOUS | Status: DC | PRN
  Administered 2014-08-05 (×2): via INTRAVENOUS

## 2014-08-05 MED ORDER — CEFOXITIN SODIUM 2 G IV SOLR
2.0000 g | Freq: Once | INTRAVENOUS | Status: AC
  Administered 2014-08-05: 2 g via INTRAVENOUS
  Filled 2014-08-05: qty 2

## 2014-08-05 MED ORDER — HYDROMORPHONE HCL 1 MG/ML IJ SOLN
0.2500 mg | INTRAMUSCULAR | Status: DC | PRN
  Administered 2014-08-05 – 2014-08-06 (×4): 0.5 mg via INTRAVENOUS

## 2014-08-05 MED ORDER — DEXTROSE 5 % IV SOLN
INTRAVENOUS | Status: AC
  Filled 2014-08-05 (×2): qty 1

## 2014-08-05 MED ORDER — MIDAZOLAM HCL 5 MG/5ML IJ SOLN
INTRAMUSCULAR | Status: DC | PRN
  Administered 2014-08-05: 2 mg via INTRAVENOUS

## 2014-08-05 MED ORDER — NALOXONE HCL 0.4 MG/ML IJ SOLN
0.4000 mg | INTRAMUSCULAR | Status: DC | PRN
  Filled 2014-08-05: qty 1

## 2014-08-05 MED ORDER — SODIUM CHLORIDE 0.9 % IJ SOLN: 9.0000 mL | INTRAMUSCULAR | Status: DC | PRN

## 2014-08-05 MED ORDER — ONDANSETRON HCL 4 MG/2ML IJ SOLN
4.0000 mg | Freq: Four times a day (QID) | INTRAMUSCULAR | Status: DC | PRN
  Filled 2014-08-05: qty 2

## 2014-08-05 MED ORDER — NEOSTIGMINE METHYLSULFATE 10 MG/10ML IV SOLN
INTRAVENOUS | Status: AC
  Filled 2014-08-05: qty 1

## 2014-08-05 MED ORDER — ROCURONIUM BROMIDE 100 MG/10ML IV SOLN
INTRAVENOUS | Status: DC | PRN
  Administered 2014-08-05: 30 mg via INTRAVENOUS
  Administered 2014-08-05: 10 mg via INTRAVENOUS
  Administered 2014-08-05: 20 mg via INTRAVENOUS

## 2014-08-05 MED ORDER — SUCCINYLCHOLINE CHLORIDE 20 MG/ML IJ SOLN
INTRAMUSCULAR | Status: AC
  Filled 2014-08-05: qty 1

## 2014-08-05 MED ORDER — HYDROMORPHONE 0.3 MG/ML IV SOLN
INTRAVENOUS | Status: DC
  Administered 2014-08-06: 1.5 mg via INTRAVENOUS
  Administered 2014-08-06: 0.54 mg via INTRAVENOUS
  Administered 2014-08-06: 01:00:00 via INTRAVENOUS
  Administered 2014-08-06: 3.34 mg via INTRAVENOUS
  Administered 2014-08-07: 1.2 mg via INTRAVENOUS
  Administered 2014-08-07: 13:00:00 via INTRAVENOUS
  Administered 2014-08-07: 0.9 mg via INTRAVENOUS
  Administered 2014-08-07: 1.5 mg via INTRAVENOUS
  Filled 2014-08-05 (×2): qty 25

## 2014-08-05 SURGICAL SUPPLY — 58 items
BLADE SURG ROTATE 9660 (MISCELLANEOUS) ×2 IMPLANT
CANISTER SUCTION 2500CC (MISCELLANEOUS) ×3 IMPLANT
CATH COUDE FOLEY 2W 5CC 16FR (CATHETERS) ×2 IMPLANT
CHLORAPREP W/TINT 26ML (MISCELLANEOUS) ×3 IMPLANT
COVER MAYO STAND STRL (DRAPES) IMPLANT
COVER SURGICAL LIGHT HANDLE (MISCELLANEOUS) ×3 IMPLANT
DRAIN CHANNEL 19F RND (DRAIN) ×2 IMPLANT
DRAPE LAPAROSCOPIC ABDOMINAL (DRAPES) ×3 IMPLANT
DRAPE UTILITY XL STRL (DRAPES) ×6 IMPLANT
DRAPE WARM FLUID 44X44 (DRAPE) ×3 IMPLANT
DRSG OPSITE POSTOP 4X10 (GAUZE/BANDAGES/DRESSINGS) IMPLANT
DRSG OPSITE POSTOP 4X8 (GAUZE/BANDAGES/DRESSINGS) IMPLANT
DRSG VAC ATS MED SENSATRAC (GAUZE/BANDAGES/DRESSINGS) ×2 IMPLANT
ELECT BLADE 6.5 EXT (BLADE) ×2 IMPLANT
ELECT CAUTERY BLADE 6.4 (BLADE) ×6 IMPLANT
ELECT REM PT RETURN 9FT ADLT (ELECTROSURGICAL) ×3
ELECTRODE REM PT RTRN 9FT ADLT (ELECTROSURGICAL) ×1 IMPLANT
EVACUATOR SILICONE 100CC (DRAIN) ×2 IMPLANT
GLOVE BIO SURGEON STRL SZ7 (GLOVE) ×3 IMPLANT
GLOVE BIOGEL PI IND STRL 7.0 (GLOVE) IMPLANT
GLOVE BIOGEL PI IND STRL 7.5 (GLOVE) ×1 IMPLANT
GLOVE BIOGEL PI IND STRL 8 (GLOVE) IMPLANT
GLOVE BIOGEL PI INDICATOR 7.0 (GLOVE) ×2
GLOVE BIOGEL PI INDICATOR 7.5 (GLOVE) ×6
GLOVE BIOGEL PI INDICATOR 8 (GLOVE) ×2
GLOVE ECLIPSE 6.5 STRL STRAW (GLOVE) ×2 IMPLANT
GLOVE ECLIPSE 7.5 STRL STRAW (GLOVE) ×2 IMPLANT
GOWN STRL REUS W/ TWL LRG LVL3 (GOWN DISPOSABLE) ×3 IMPLANT
GOWN STRL REUS W/TWL LRG LVL3 (GOWN DISPOSABLE) ×9
KIT BASIN OR (CUSTOM PROCEDURE TRAY) ×3 IMPLANT
KIT ROOM TURNOVER OR (KITS) ×3 IMPLANT
LIGASURE IMPACT 36 18CM CVD LR (INSTRUMENTS) ×2 IMPLANT
NS IRRIG 1000ML POUR BTL (IV SOLUTION) ×6 IMPLANT
PACK GENERAL/GYN (CUSTOM PROCEDURE TRAY) ×3 IMPLANT
PAD ARMBOARD 7.5X6 YLW CONV (MISCELLANEOUS) ×3 IMPLANT
PAD NEG PRESSURE SENSATRAC (MISCELLANEOUS) ×2 IMPLANT
RELOAD PROXIMATE 75MM BLUE (ENDOMECHANICALS) ×6 IMPLANT
RELOAD STAPLE 75 3.8 BLU REG (ENDOMECHANICALS) IMPLANT
SPECIMEN JAR LARGE (MISCELLANEOUS) ×2 IMPLANT
SPONGE GAUZE 4X4 12PLY STER LF (GAUZE/BANDAGES/DRESSINGS) ×2 IMPLANT
SPONGE LAP 18X18 X RAY DECT (DISPOSABLE) ×2 IMPLANT
STAPLER GUN LINEAR PROX 60 (STAPLE) ×2 IMPLANT
STAPLER PROXIMATE 75MM BLUE (STAPLE) ×2 IMPLANT
STAPLER VISISTAT 35W (STAPLE) ×3 IMPLANT
SUCTION POOLE TIP (SUCTIONS) ×3 IMPLANT
SUT ETHILON 2 0 FS 18 (SUTURE) ×2 IMPLANT
SUT PDS AB 1 TP1 96 (SUTURE) ×6 IMPLANT
SUT SILK 2 0 SH CR/8 (SUTURE) ×5 IMPLANT
SUT SILK 2 0 TIES 10X30 (SUTURE) ×3 IMPLANT
SUT SILK 3 0 SH CR/8 (SUTURE) ×3 IMPLANT
SUT SILK 3 0 TIES 10X30 (SUTURE) ×3 IMPLANT
SUT VIC AB 3-0 SH 18 (SUTURE) IMPLANT
TAPE CLOTH SURG 4X10 WHT LF (GAUZE/BANDAGES/DRESSINGS) ×2 IMPLANT
TOWEL OR 17X26 10 PK STRL BLUE (TOWEL DISPOSABLE) ×3 IMPLANT
TRAY FOLEY CATH 16FRSI W/METER (SET/KITS/TRAYS/PACK) IMPLANT
TUBE CONNECTING 12'X1/4 (SUCTIONS)
TUBE CONNECTING 12X1/4 (SUCTIONS) IMPLANT
YANKAUER SUCT BULB TIP NO VENT (SUCTIONS) ×2 IMPLANT

## 2014-08-05 NOTE — ED Notes (Signed)
Patient states that he had fallen 12 feet off of a roof and landed on his back.  States that he landed on 2 bags of concrete.

## 2014-08-05 NOTE — Anesthesia Procedure Notes (Signed)
Procedure Name: Intubation Date/Time: 08/05/2014 9:50 PM Performed by: Terrence Pizana S Pre-anesthesia Checklist: Patient identified, Timeout performed, Emergency Drugs available, Suction available and Patient being monitored Patient Re-evaluated:Patient Re-evaluated prior to inductionOxygen Delivery Method: Circle system utilized Preoxygenation: Pre-oxygenation with 100% oxygen Intubation Type: IV induction, Rapid sequence and Cricoid Pressure applied Laryngoscope Size: Mac and 4 Grade View: Grade I Tube type: Subglottic suction tube Tube size: 7.5 mm Number of attempts: 1 Airway Equipment and Method: Stylet Placement Confirmation: ETT inserted through vocal cords under direct vision,  positive ETCO2 and breath sounds checked- equal and bilateral Secured at: 22 cm Tube secured with: Tape Dental Injury: Teeth and Oropharynx as per pre-operative assessment

## 2014-08-05 NOTE — H&P (Addendum)
History   Trevor [name] is an 49 y.o. male.   Chief Complaint:  Chief Complaint  Patient presents with  . Fall  Transfer from [facility] for fall with possible free intraperitoneal air  Fall Associated symptoms include abdominal pain (minimal abdominal pain). Pertinent negatives include no chest pain, coughing, headaches, myalgias, nausea, neck pain or vomiting.  This is a 49 yo male with multiple medical problems who presents about 12-13 hours s/p a fall about 12 feet off a ladder.  He landed on his back on a couple of bags of concrete.  No LOC.  Complaining mostly of lower back pain.  He didn't present to [facility] for evaluation until about 5-6 hours s/p accident.  He was evaluated with labs and CT scans of his cervical, thoracic, and lumbar spine only.  The spine cuts showed possible free intraperitoneal and retroperitoneal air and CT scan of the abdomen and pelvis were recommended for further evaluation.  The patient was transferred to [facility] for further evaluation.  He has minimal abdominal tenderness.   Past Medical History  Diagnosis Date  . Diabetes mellitus   . Hypertension   . Hepatitis C   . High cholesterol   . MI, old   Recent evaluation for syncope, felt to be secondary to dehydration  Past Surgical History  Procedure Laterality Date  . Wrist surgery    . Knee surgery    . Hernia repair      Family History  Problem Relation Age of Onset  . Alcohol abuse Father   . Alcohol abuse Mother   . Alcohol abuse Brother   . Arthritis Father   . Arthritis Mother   . Diabetes Mother   . Heart disease Father   . Heart disease Mother   . Hypertension Father   . Hypertension Mother   . Hypertension Brother    Social History:  reports that he has been smoking Cigarettes.  He started smoking about 32 years ago. He has been smoking about 0.50 packs per day. He has never used smokeless tobacco. He reports that he uses illicit drugs (Marijuana). He reports that he does  not drink alcohol.  Allergies  No Known Allergies Patient reports that IV contrast dye put him into renal failure. Home Medications   Prior to Admission medications   Medication Sig Start Date End Date Taking? Authorizing Provider  atorvastatin (LIPITOR) 40 MG tablet Take 40 mg by mouth daily.    Historical Provider, MD  FLUoxetine (PROZAC) 20 MG capsule Take 20 mg by mouth every evening.    Historical Provider, MD  gabapentin (NEURONTIN) 300 MG capsule Take 300 mg by mouth 3 (three) times daily.    Historical Provider, MD  hydrOXYzine (ATARAX/VISTARIL) 25 MG tablet Take 25 mg by mouth at bedtime.    Historical Provider, MD  levothyroxine (SYNTHROID, LEVOTHROID) 25 MCG tablet Take 25 mcg by mouth daily before breakfast.    Historical Provider, MD  lisinopril (PRINIVIL,ZESTRIL) 20 MG tablet Take 20 mg by mouth daily.    Historical Provider, MD  metFORMIN (GLUCOPHAGE-XR) 500 MG 24 hr tablet Take 500 mg by mouth every evening.    Historical Provider, MD  metoprolol succinate (TOPROL-XL) 50 MG 24 hr tablet Take 50 mg by mouth daily. Take with or immediately following a meal.    Historical Provider, MD  sulindac (CLINORIL) 150 MG tablet Take 150 mg by mouth 2 (two) times daily.    Historical Provider, MD     Trauma Course  Labs from [facility] Na 141 K 4.5 Chloride 105 Bicarb 24.4 BUN 120 Cr .92 LFT's WNL except alk phos 151 Lipase 93 WBC 11.8 Hgb 14.8 Hct 43.5 Plat 215  CT C/T/L spine from [facility] 1.  No sign of cervical spine injury 2.  No sign of thoracic spine injury; degenerative disc changes 3.  Left transverse process fracture L1-L2 4.  Mild stenosis L1-2 and L4-5 5.  Several foci of free peritoneal/ retroperitoneal air  Review of Systems  Constitutional: Negative for weight loss.  HENT: Negative for ear discharge, ear pain, hearing loss and tinnitus.   Eyes: Negative for blurred vision, double vision, photophobia and pain.  Respiratory: Negative for cough, sputum  production and shortness of breath.   Cardiovascular: Negative for chest pain.  Gastrointestinal: Positive for abdominal pain (minimal abdominal pain). Negative for nausea and vomiting.  Genitourinary: Negative for dysuria, urgency, frequency and flank pain.  Musculoskeletal: Positive for back pain and falls. Negative for myalgias, joint pain and neck pain.  Neurological: Negative for dizziness, tingling, sensory change, focal weakness, loss of consciousness and headaches.  Endo/Heme/Allergies: Does not bruise/bleed easily.  Psychiatric/Behavioral: Negative for depression, memory loss and substance abuse. The patient is not nervous/anxious.     Blood pressure 149/93, pulse 62, temperature 97.7 F (36.5 C), temperature source Oral, resp. rate 15, SpO2 96 %. Physical Exam  Constitutional: He is oriented to person, place, and time. He appears well-developed and well-nourished.  HENT:  Head: Normocephalic and atraumatic.  Eyes: EOM are normal.  Neck: Normal range of motion. Neck supple.  Cardiovascular: Normal rate and regular rhythm.   Respiratory: Effort normal and breath sounds normal.  GI: Soft. Bowel sounds are normal. There is tenderness (minimal epigastric tenderness; no palpable masses).  Musculoskeletal: Normal range of motion.  Tender lower back  Neurological: He is alert and oriented to person, place, and time.  Skin: Skin is warm and dry.     Assessment/Plan Fall from 12 feet 1.  Left transverse process fractures L1-2 2.  No signs of peritonitis on physical examination  Plan:  Admit to trauma service for pain control Evaluate abdomen with CT chest/ abd/ pelvis - will avoid IV contrast due to patient's history of renal failure after receiving IV contrast   Cayle Thunder K. 08/05/2014, 7:54 PM  Addendum:   CT Chest/ Abd/ Pelvis shows a large amount of free intraperitoneal air, likely coming from a small bowel injury in the midline upper pelvis.  I discussed this with the  patient and his family and recommend immediate exploratory laparotomy with small bowel resection.  The surgical procedure has been discussed with the patient.  Potential risks, benefits, alternative treatments, and expected outcomes have been explained.  All of the patient's questions at this time have been answered.  The likelihood of reaching the patient's treatment goal is good.  The patient understand the proposed surgical procedure and wishes to proceed.   Imogene Burn. Georgette Dover, MD, [facility]  General/ Trauma Surgery  08/05/2014 9:11 PM  Procedures

## 2014-08-05 NOTE — Transfer of Care (Signed)
Immediate Anesthesia Transfer of Care Note  Patient: Trevor MewRobert L Bouza  Procedure(s) Performed: Procedure(s): EXPLORATORY LAPAROTOMY, SMALL BOWEL RESECTION (N/A)  Patient Location: PACU  Anesthesia Type:General  Level of Consciousness: awake, alert  and oriented  Airway & Oxygen Therapy: Patient Spontanous Breathing and Patient connected to face mask oxygen  Post-op Assessment: Report given to RN and Post -op Vital signs reviewed and stable  Post vital signs: Reviewed and stable  Last Vitals:  Filed Vitals:   08/05/14 1938  BP: 149/93  Pulse: 62  Temp: 36.5 C  Resp: 15    Complications: No apparent anesthesia complications

## 2014-08-05 NOTE — Op Note (Signed)
Preop diagnosis: Fall from 12 feet with free intra-abdominal air Postop diagnosis: Perforated small bowel Procedure performed: Exploratory laparotomy, small bowel resection, placement of pelvic drain, placement of abdominal wound VAC 20 cm2 Surgeon:Katora Fini K. Asst.: Dr. Megan Mans Anesthesia: Gen. endotracheal Indications: This is a 49 year old male who is approximately 15 our status post fall from a 12 foot height. He landed on his back. There was no loss of consciousness. He presented to [facility] [place] 2 PM with some lower back pain. He had minimal abdominal pain. They evaluated him with CT scans of his cervical, thoracic, and lumbar spines. He had transverse process fractures in his lumbar spine. Incidentally he was noted to have some small areas of free air in the peritoneum. He was transported to [facility] where he underwent CT scan of the chest abdomen and pelvis. He was found to have a moderate amount of free intraperitoneal air which appeared to be coming from a perforated segment of small bowel in the mid pelvis. Based on these findings, we recommended immediate exploratory laparotomy.  Description of procedure: The patient is brought to the operating room and placed in a supine position on the operating room table. After an adequate level of general anesthesia was obtained a Coude catheter was placed under sterile technique. The patient apparently has some prostatic hypertrophy because the Foley catheter would not pass easily so we had to switch to a coud catheter. This passed easily with good urine return. His abdomen was shaved, prepped with ChloraPrep and draped in sterile fashion. A timeout was taken to ensure the proper patient and proper procedure. We made a lower midline incision. Dissection was carried down to the linea alba which was divided vertically with cautery. We into the peritoneal cavity. There was a moderate amount of cloudy appearing fluid  but no gross purulence or stool. We placed the Balfour retractor. The small bowel was encountered appeared to be healthy. We were able to identify the terminal ileum at the cecum. We ran the small bowel retrograde fashion. In the distal to mid ileum the small bowel seems to be densely adherent down into the pelvis. We identified the sigmoid colon and the small bowel seems to be adherent to the mesentery of the sigmoid colon. We bluntly dissect this apart and this was obviously the area of inflammation and perforation. There was no gross spillage of stool or succus entericus. About 15 cm distal to this perforated area there is another area of small bowel that seems to be mildly inflamed and was bleeding. We ran the remainder of the small bowel proximally and there are no other injuries. The visible portions of the colon were examined and there are no other signs of injury. A palpated the liver which appeared to be fairly nodular and hard. The stomach was soft and I could palpate the nasogastric tube within the stomach. There is no sign of hemoperitoneum. We then resected the small bowel. We divided just proximal to the area of inflammation with a GIA-75 stapler. We divided distal to the second area that was inflamed and bleeding with a GIA-75 stapler. We divided the mesentery with the LigaSure device. A side-to-side anastomosis was created with the GIA-75 stapler. A 3-0 silk suture was used to reinforce the crotch. The staple line was oversewn with 3-0 silk sutures. The mesenteric defect was closed with 2-0 silk sutures. The anastomosis was patent with no sign of leak. We replaced a small back in the abdomen  and packed with the upper portion of the abdomen. We then examined the sigmoid colon. All the thickening and inflammation seems to be in the mesentery of the sigmoid colon and not actually involving the sigmoid colon itself. It appeared that the small bowel had been inflamed and had densely adherent down to the  mesentery and this had been ripped apart whenever he fell. This likely cause free air. We cannot identify any communication with the colon itself. We made decision not to proceed with any resection but instead placed a drain in this area. A 19 [place] Blake drain was brought out through a stab incision in the left lower quadrant. This was sutured in place with a 2-0 nylon suture. We irrigated the abdomen thoroughly until clear. The omentum was placed over the bowel and we closed the fascia with double-stranded #1 PDS suture. A medium VAC sponge was cut to fit the subcutaneous tissues. This was sealed with an occlusive drape and placed 125 mmHg suction. There was a good seal. The patient was then next made about the recovery room in stable condition. All sponge, instrument, and needle counts are correct.  Wilmon ArmsMatthew K. Corliss Skainssuei, MD, [facility]  General/ Trauma Surgery  08/05/2014 11:42 PM

## 2014-08-05 NOTE — ED Provider Notes (Signed)
CSN: [id number]     Arrival date & time 08/05/14  1914 History   First MD Initiated Contact with Patient 08/05/14 1917     Chief Complaint  Patient presents with  . Fall     (Consider location/radiation/quality/duration/timing/severity/associated sxs/prior Treatment) HPI  49 year old male presents as a transfer from [place] after a fall from a roof. Patient states he was about 10-12 feet off the ground and he fell off the roof onto a bag of concrete. He did not lose consciousness or hit his head. He's complaining mostly of low back pain. CT scans of his cervical, thoracic, and lumbar spine were obtained at the outside hospital. Shows L1 and L2 transverse process fractures and also shows concern for free air in the abdomen. Abdominal CT was not done. Patient was transferred here for evaluation by trauma surgery. Patient is currently complaining of pain, mostly in his back but states he does have some in his abdomen. No weakness or numbness in his extremities.  Past Medical History  Diagnosis Date  . Diabetes mellitus   . Hypertension   . Hepatitis C   . High cholesterol   . MI, old    Past Surgical History  Procedure Laterality Date  . Wrist surgery    . Knee surgery    . Hernia repair     Family History  Problem Relation Age of Onset  . Alcohol abuse Father   . Alcohol abuse Mother   . Alcohol abuse Brother   . Arthritis Father   . Arthritis Mother   . Diabetes Mother   . Heart disease Father   . Heart disease Mother   . Hypertension Father   . Hypertension Mother   . Hypertension Brother    History  Substance Use Topics  . Smoking status: Current Every Day Smoker -- 0.50 packs/day    Types: Cigarettes    Start date: 05/21/1982  . Smokeless tobacco: Never Used  . Alcohol Use: No    Review of Systems  Constitutional: Negative for fever.  Gastrointestinal: Positive for abdominal pain. Negative for vomiting.  Musculoskeletal: Positive for back pain. Negative for  neck pain.  Neurological: Negative for weakness and numbness.  All other systems reviewed and are negative.     Allergies  Review of patient's allergies indicates no known allergies.  Home Medications   Prior to Admission medications   Medication Sig Start Date End Date Taking? Authorizing Provider  atorvastatin (LIPITOR) 40 MG tablet Take 40 mg by mouth daily.    Historical Provider, MD  FLUoxetine (PROZAC) 20 MG capsule Take 20 mg by mouth every evening.    Historical Provider, MD  gabapentin (NEURONTIN) 300 MG capsule Take 300 mg by mouth 3 (three) times daily.    Historical Provider, MD  hydrOXYzine (ATARAX/VISTARIL) 25 MG tablet Take 25 mg by mouth at bedtime.    Historical Provider, MD  levothyroxine (SYNTHROID, LEVOTHROID) 25 MCG tablet Take 25 mcg by mouth daily before breakfast.    Historical Provider, MD  lisinopril (PRINIVIL,ZESTRIL) 20 MG tablet Take 20 mg by mouth daily.    Historical Provider, MD  metFORMIN (GLUCOPHAGE-XR) 500 MG 24 hr tablet Take 500 mg by mouth every evening.    Historical Provider, MD  metoprolol succinate (TOPROL-XL) 50 MG 24 hr tablet Take 50 mg by mouth daily. Take with or immediately following a meal.    Historical Provider, MD  sulindac (CLINORIL) 150 MG tablet Take 150 mg by mouth 2 (two) times daily.  Historical Provider, MD   There were no vitals taken for this visit. Physical Exam  Constitutional: He is oriented to person, place, and time. He appears well-developed and well-nourished.  HENT:  Head: Normocephalic and atraumatic.  Right Ear: External ear normal.  Left Ear: External ear normal.  Nose: Nose normal.  Eyes: Right eye exhibits no discharge. Left eye exhibits no discharge.  Neck: Neck supple.  Cardiovascular: Normal rate, regular rhythm, normal heart sounds and intact distal pulses.   Pulmonary/Chest: Effort normal. He exhibits no tenderness.  No chest trauma noted  Abdominal: Soft. There is tenderness (mild). There is no  rigidity, no rebound and no guarding.  Musculoskeletal: He exhibits no edema.       Lumbar back: He exhibits tenderness.  Neurological: He is alert and oriented to person, place, and time.  Skin: Skin is warm and dry.  Nursing note and vitals reviewed.   ED Course  Procedures (including critical care time) Labs Review Labs Reviewed - No data to display  Imaging Review Ct Abdomen Pelvis Wo Contrast  08/05/2014   CLINICAL DATA:  Larey Seat off house this morning with spine fractures impossible bowel rupture.  EXAM: CT CHEST, ABDOMEN AND PELVIS WITHOUT CONTRAST  TECHNIQUE: Multidetector CT imaging of the chest, abdomen and pelvis was performed following the standard protocol without IV contrast.  COMPARISON:  07/17/2014, 05/18/2012 and 09/26/2010  FINDINGS: CT CHEST FINDINGS  Lungs are adequately inflated demonstrate no focal consolidation, effusion or pneumothorax. There is a 3 mm nodular density over the posterior segment of the left upper lobe. Airways are within normal.  The heart is within normal in size. There is subtle calcification over the left anterior descending and right coronary arteries. There is no mediastinal, hilar or axillary adenopathy. Remaining mediastinal structures are within normal. Bones soft tissues are normal.  CT ABDOMEN AND PELVIS FINDINGS  Abdominal images demonstrate a moderate amount of free peritoneal air. The etiology of this free air is likely due to injury to a short segment of small bowel over the midline lower abdomen. There is stranding of the adjacent mesentery and multiple small foci of extraluminal air associated with this short segment of injured small bowel. There appears to be an adjacent small hematoma in the mesentery just inferior/ posterior to this small-bowel measuring 1.3 x 2.8 cm. The edge of this possible small hematoma abuts the adjacent sigmoid colon.  The liver, spleen, gallbladder,, pancreas and adrenal glands are within normal. Kidneys are within  normal. Ureters are normal. Appendix is normal. There is minimal diverticulosis of the colon. There is minimal calcified plaque over the abdominal aorta and iliac arteries.  Pelvic images demonstrate a normal bladder, prostate and rectum. Evidence of patient's known left L1 and L2 transverse process fractures. Degenerative change of the spine and stable grade 1 anterolisthesis of L5 on S1.  IMPRESSION: Moderate free peritoneal air likely due to posttraumatic injury of a short segment of small bowel over the lower midline abdomen with small adjacent mesenteric hematoma.  Known displaced left L1 and L2 transverse process fractures.  No acute findings within the chest.  3 mm nodular density over the posterior aspect of the left upper lobe. Recommend followup chest CT 1 year.  This recommendation follows the consensus statement: Guidelines for Management of Small Pulmonary Nodules Detected on CT Scans: A Statement from the Fleischner Society as published in Radiology 2005; 237:395-400. Online at: DietDisorder.cz.  Minimal diverticulosis of the colon.  Mild atherosclerotic coronary artery disease.  These results  were called by telephone at the time of interpretation on 08/05/2014 at 9:15 pm to Dr. Marcille BlancoMatt Tsuei , who verbally acknowledged these results.   Electronically Signed   By: Elberta Fortisaniel  Boyle M.D.   On: 08/05/2014 21:14   Ct Chest Wo Contrast  08/05/2014   CLINICAL DATA:  Larey SeatFell off house this morning with spine fractures impossible bowel rupture.  EXAM: CT CHEST, ABDOMEN AND PELVIS WITHOUT CONTRAST  TECHNIQUE: Multidetector CT imaging of the chest, abdomen and pelvis was performed following the standard protocol without IV contrast.  COMPARISON:  07/17/2014, 05/18/2012 and 09/26/2010  FINDINGS: CT CHEST FINDINGS  Lungs are adequately inflated demonstrate no focal consolidation, effusion or pneumothorax. There is a 3 mm nodular density over the posterior segment of the left upper  lobe. Airways are within normal.  The heart is within normal in size. There is subtle calcification over the left anterior descending and right coronary arteries. There is no mediastinal, hilar or axillary adenopathy. Remaining mediastinal structures are within normal. Bones soft tissues are normal.  CT ABDOMEN AND PELVIS FINDINGS  Abdominal images demonstrate a moderate amount of free peritoneal air. The etiology of this free air is likely due to injury to a short segment of small bowel over the midline lower abdomen. There is stranding of the adjacent mesentery and multiple small foci of extraluminal air associated with this short segment of injured small bowel. There appears to be an adjacent small hematoma in the mesentery just inferior/ posterior to this small-bowel measuring 1.3 x 2.8 cm. The edge of this possible small hematoma abuts the adjacent sigmoid colon.  The liver, spleen, gallbladder,, pancreas and adrenal glands are within normal. Kidneys are within normal. Ureters are normal. Appendix is normal. There is minimal diverticulosis of the colon. There is minimal calcified plaque over the abdominal aorta and iliac arteries.  Pelvic images demonstrate a normal bladder, prostate and rectum. Evidence of patient's known left L1 and L2 transverse process fractures. Degenerative change of the spine and stable grade 1 anterolisthesis of L5 on S1.  IMPRESSION: Moderate free peritoneal air likely due to posttraumatic injury of a short segment of small bowel over the lower midline abdomen with small adjacent mesenteric hematoma.  Known displaced left L1 and L2 transverse process fractures.  No acute findings within the chest.  3 mm nodular density over the posterior aspect of the left upper lobe. Recommend followup chest CT 1 year.  This recommendation follows the consensus statement: Guidelines for Management of Small Pulmonary Nodules Detected on CT Scans: A Statement from the Fleischner Society as published in  Radiology 2005; 237:395-400. Online at: DietDisorder.[email or web address].[employer].edu/rad/res/Fleischner-nodule.htm.  Minimal diverticulosis of the colon.  Mild atherosclerotic coronary artery disease.  These results were called by telephone at the time of interpretation on 08/05/2014 at 9:15 pm to Dr. Marcille BlancoMatt Tsuei , who verbally acknowledged these results.   Electronically Signed   By: Elberta Fortisaniel  Boyle M.D.   On: 08/05/2014 21:14     EKG Interpretation None      MDM   Final diagnoses:  Closed fracture of transverse process of lumbar vertebra, initial encounter  Pneumoperitoneum    Patient transferred here from outside facility after a fall with lumbar transverse process fractures and pneumoperitoneum seen on the CT of his L-spine. Patient has mild abdominal tenderness but no surgical abdomen. Well-appearing here. Discussed with surgery, they recommend full trauma scans. He is unable to get contrast because last having a contrasted went into renal failure. CT here shows pneumoperitoneum with  likely small bowel injury. Surgery to give anabiotic and take to the operating room.    Pricilla Loveless, MD 08/05/14 [phone]

## 2014-08-05 NOTE — ED Notes (Signed)
Patient is a transfer from [place].  Patient fell off a house this AM and suffered multiple spinal Fractures and a possible ruptured bowel

## 2014-08-05 NOTE — Anesthesia Preprocedure Evaluation (Addendum)
Anesthesia Evaluation  Patient identified by MRN, date of birth, ID band Patient awake    Reviewed: Allergy & Precautions, NPO status , Patient's Chart, lab work & pertinent test results, reviewed documented beta blocker date and time   History of Anesthesia Complications Negative for: history of anesthetic complications  Airway Mallampati: II  TM Distance: >3 FB Neck ROM: Full    Dental  (+) Edentulous Upper, Edentulous Lower   Pulmonary Current Smoker,  breath sounds clear to auscultation        Cardiovascular hypertension, Pt. on medications and Pt. on home beta blockers + Past MI Rhythm:regular Rate:Normal  Pt has no recollection of ever having a heart attack.  Denies chest pains.   Neuro/Psych    GI/Hepatic negative GI ROS, (+)     substance abuse  , Hepatitis -, C  Endo/Other  diabetes, Type 2, Oral Hypoglycemic AgentsMorbid obesity  Renal/GU      Musculoskeletal  (+) Arthritis -, Osteoarthritis,    Abdominal   Peds  Hematology   Anesthesia Other Findings   Reproductive/Obstetrics                         Anesthesia Physical Anesthesia Plan  ASA: III and emergent  Anesthesia Plan: General   Post-op Pain Management:    Induction: Intravenous, Rapid sequence and Cricoid pressure planned  Airway Management Planned: Oral ETT  Additional Equipment:   Intra-op Plan:   Post-operative Plan: Extubation in OR and Possible Post-op intubation/ventilation  Informed Consent: I have reviewed the patients History and Physical, chart, labs and discussed the procedure including the risks, benefits and alternatives for the proposed anesthesia with the patient or authorized representative who has indicated his/her understanding and acceptance.   Dental advisory given and History available from chart only  Plan Discussed with: CRNA, Anesthesiologist and Surgeon  Anesthesia Plan Comments:         Anesthesia Quick Evaluation

## 2014-08-06 ENCOUNTER — Encounter (HOSPITAL_COMMUNITY): Payer: Self-pay | Admitting: Surgery

## 2014-08-06 DIAGNOSIS — Z8249 Family history of ischemic heart disease and other diseases of the circulatory system: Secondary | ICD-10-CM | POA: Diagnosis not present

## 2014-08-06 DIAGNOSIS — K651 Peritoneal abscess: Secondary | ICD-10-CM | POA: Diagnosis present

## 2014-08-06 DIAGNOSIS — S36439A Laceration of unspecified part of small intestine, initial encounter: Secondary | ICD-10-CM | POA: Diagnosis present

## 2014-08-06 DIAGNOSIS — Z79899 Other long term (current) drug therapy: Secondary | ICD-10-CM | POA: Diagnosis not present

## 2014-08-06 DIAGNOSIS — I10 Essential (primary) hypertension: Secondary | ICD-10-CM | POA: Diagnosis present

## 2014-08-06 DIAGNOSIS — S36409A Unspecified injury of unspecified part of small intestine, initial encounter: Secondary | ICD-10-CM | POA: Diagnosis present

## 2014-08-06 DIAGNOSIS — S32019A Unspecified fracture of first lumbar vertebra, initial encounter for closed fracture: Secondary | ICD-10-CM | POA: Diagnosis present

## 2014-08-06 DIAGNOSIS — M545 Low back pain: Secondary | ICD-10-CM | POA: Diagnosis present

## 2014-08-06 DIAGNOSIS — W132XXA Fall from, out of or through roof, initial encounter: Secondary | ICD-10-CM | POA: Diagnosis present

## 2014-08-06 DIAGNOSIS — E876 Hypokalemia: Secondary | ICD-10-CM | POA: Diagnosis not present

## 2014-08-06 DIAGNOSIS — E119 Type 2 diabetes mellitus without complications: Secondary | ICD-10-CM | POA: Diagnosis present

## 2014-08-06 DIAGNOSIS — B1920 Unspecified viral hepatitis C without hepatic coma: Secondary | ICD-10-CM | POA: Diagnosis present

## 2014-08-06 DIAGNOSIS — F17210 Nicotine dependence, cigarettes, uncomplicated: Secondary | ICD-10-CM | POA: Diagnosis present

## 2014-08-06 DIAGNOSIS — W19XXXA Unspecified fall, initial encounter: Secondary | ICD-10-CM | POA: Diagnosis present

## 2014-08-06 DIAGNOSIS — I252 Old myocardial infarction: Secondary | ICD-10-CM | POA: Diagnosis not present

## 2014-08-06 DIAGNOSIS — K567 Ileus, unspecified: Secondary | ICD-10-CM | POA: Diagnosis not present

## 2014-08-06 DIAGNOSIS — E780 Pure hypercholesterolemia: Secondary | ICD-10-CM | POA: Diagnosis present

## 2014-08-06 LAB — COMPREHENSIVE METABOLIC PANEL
ALT: 22 U/L (ref 17–63)
AST: 24 U/L (ref 15–41)
Albumin: 3.8 g/dL (ref 3.5–5.0)
Alkaline Phosphatase: 103 U/L (ref 38–126)
Anion gap: 9 (ref 5–15)
BUN: 7 mg/dL (ref 6–20)
CALCIUM: 7.8 mg/dL — AB (ref 8.9–10.3)
CO2: 22 mmol/L (ref 22–32)
Chloride: 106 mmol/L (ref 101–111)
Creatinine, Ser: 1.13 mg/dL (ref 0.61–1.24)
GFR calc Af Amer: >60 mL/min (ref >60)
GFR calc non Af Amer: >60 mL/min (ref >60)
Glucose, Bld: 194 mg/dL — ABNORMAL HIGH (ref 65–99)
Potassium: 3.8 mmol/L (ref 3.5–5.1)
Sodium: 137 mmol/L (ref 135–145)
Total Bilirubin: 1 mg/dL (ref 0.3–1.2)
Total Protein: 6.6 g/dL (ref 6.5–8.1)

## 2014-08-06 LAB — CBC
HEMATOCRIT: 39.2 % (ref 39.0–52.0)
HEMATOCRIT: 43.5 % (ref 39.0–52.0)
HEMOGLOBIN: 13.4 g/dL (ref 13.0–17.0)
Hemoglobin: 15 g/dL (ref 13.0–17.0)
MCH: 29.2 pg (ref 26.0–34.0)
MCH: 30 pg (ref 26.0–34.0)
MCHC: 34.2 g/dL (ref 30.0–36.0)
MCHC: 34.5 g/dL (ref 30.0–36.0)
MCV: 85.4 fL (ref 78.0–100.0)
MCV: 87 fL (ref 78.0–100.0)
Platelets: 176 10*3/uL (ref 150–400)
Platelets: 191 10*3/uL (ref 150–400)
RBC: 4.59 MIL/uL (ref 4.22–5.81)
RBC: 5 MIL/uL (ref 4.22–5.81)
RDW: 13.5 % (ref 11.5–15.5)
RDW: 13.6 % (ref 11.5–15.5)
WBC: 13.3 10*3/uL — ABNORMAL HIGH (ref 4.0–10.5)
WBC: 14.7 10*3/uL — AB (ref 4.0–10.5)

## 2014-08-06 LAB — CREATININE, SERUM
Creatinine, Ser: 1.15 mg/dL (ref 0.61–1.24)
GFR calc Af Amer: >60 mL/min (ref >60)

## 2014-08-06 LAB — GLUCOSE, CAPILLARY
GLUCOSE-CAPILLARY: 125 mg/dL — AB (ref 65–99)
GLUCOSE-CAPILLARY: 170 mg/dL — AB (ref 65–99)
GLUCOSE-CAPILLARY: 180 mg/dL — AB (ref 65–99)
GLUCOSE-CAPILLARY: 181 mg/dL — AB (ref 65–99)
Glucose-Capillary: 153 mg/dL — ABNORMAL HIGH (ref 65–99)
Glucose-Capillary: 212 mg/dL — ABNORMAL HIGH (ref 65–99)

## 2014-08-06 MED ORDER — TIOTROPIUM BROMIDE MONOHYDRATE 18 MCG IN CAPS
18.0000 ug | ORAL_CAPSULE | Freq: Every day | RESPIRATORY_TRACT | Status: DC
  Administered 2014-08-10 – 2014-08-13 (×4): 18 ug via RESPIRATORY_TRACT
  Filled 2014-08-06 (×4): qty 5

## 2014-08-06 MED ORDER — DEXTROSE 5 % IV SOLN
2.0000 g | Freq: Four times a day (QID) | INTRAVENOUS | Status: AC
  Administered 2014-08-06 – 2014-08-08 (×12): 2 g via INTRAVENOUS
  Filled 2014-08-06 (×12): qty 2

## 2014-08-06 MED ORDER — ENOXAPARIN SODIUM 40 MG/0.4ML ~~LOC~~ SOLN: 40.0000 mg | SUBCUTANEOUS | Status: DC

## 2014-08-06 MED ORDER — INSULIN ASPART 100 UNIT/ML ~~LOC~~ SOLN
0.0000 [IU] | SUBCUTANEOUS | Status: DC
  Administered 2014-08-06 (×2): 2 [IU] via SUBCUTANEOUS
  Administered 2014-08-06 (×2): 3 [IU] via SUBCUTANEOUS
  Administered 2014-08-06: 5 [IU] via SUBCUTANEOUS
  Administered 2014-08-07: 2 [IU] via SUBCUTANEOUS
  Administered 2014-08-07 (×2): 3 [IU] via SUBCUTANEOUS
  Administered 2014-08-07: 2 [IU] via SUBCUTANEOUS
  Administered 2014-08-07: 3 [IU] via SUBCUTANEOUS
  Administered 2014-08-08 – 2014-08-12 (×12): 2 [IU] via SUBCUTANEOUS

## 2014-08-06 MED ORDER — HYDROMORPHONE HCL 1 MG/ML IJ SOLN
INTRAMUSCULAR | Status: AC
  Filled 2014-08-06: qty 1

## 2014-08-06 MED ORDER — SODIUM CHLORIDE 0.9 % IV SOLN
INTRAVENOUS | Status: DC
  Administered 2014-08-06 – 2014-08-11 (×7): via INTRAVENOUS
  Administered 2014-08-12: 1 mL via INTRAVENOUS

## 2014-08-06 MED ORDER — PANTOPRAZOLE SODIUM 40 MG IV SOLR
40.0000 mg | Freq: Every day | INTRAVENOUS | Status: DC
  Administered 2014-08-08 – 2014-08-09 (×3): 40 mg via INTRAVENOUS
  Filled 2014-08-06 (×3): qty 40

## 2014-08-06 MED ORDER — PANTOPRAZOLE SODIUM 40 MG PO TBEC
40.0000 mg | DELAYED_RELEASE_TABLET | Freq: Every day | ORAL | Status: DC
  Administered 2014-08-06 – 2014-08-13 (×6): 40 mg via ORAL
  Filled 2014-08-06 (×6): qty 1

## 2014-08-06 MED ORDER — FLUOXETINE HCL 20 MG PO CAPS
20.0000 mg | ORAL_CAPSULE | Freq: Every evening | ORAL | Status: DC
  Administered 2014-08-06 – 2014-08-12 (×5): 20 mg via ORAL
  Filled 2014-08-06 (×6): qty 1

## 2014-08-06 MED ORDER — METOPROLOL SUCCINATE ER 50 MG PO TB24
50.0000 mg | ORAL_TABLET | Freq: Every day | ORAL | Status: DC
  Administered 2014-08-06 – 2014-08-07 (×2): 50 mg via ORAL
  Filled 2014-08-06 (×2): qty 1

## 2014-08-06 MED ORDER — LEVOTHYROXINE SODIUM 25 MCG PO TABS
25.0000 ug | ORAL_TABLET | Freq: Every day | ORAL | Status: DC
  Administered 2014-08-06 – 2014-08-13 (×7): 25 ug via ORAL
  Filled 2014-08-06 (×8): qty 1

## 2014-08-06 MED ORDER — HYDRALAZINE HCL 20 MG/ML IJ SOLN
INTRAMUSCULAR | Status: AC
  Filled 2014-08-06: qty 1

## 2014-08-06 MED ORDER — ENOXAPARIN SODIUM 40 MG/0.4ML ~~LOC~~ SOLN
40.0000 mg | Freq: Two times a day (BID) | SUBCUTANEOUS | Status: DC
  Administered 2014-08-06 – 2014-08-13 (×15): 40 mg via SUBCUTANEOUS
  Filled 2014-08-06 (×15): qty 0.4

## 2014-08-06 MED ORDER — LISINOPRIL 20 MG PO TABS
20.0000 mg | ORAL_TABLET | Freq: Every day | ORAL | Status: DC
  Administered 2014-08-06 – 2014-08-08 (×3): 20 mg via ORAL
  Filled 2014-08-06 (×3): qty 1

## 2014-08-06 MED ORDER — LABETALOL HCL 5 MG/ML IV SOLN
5.0000 mg | INTRAVENOUS | Status: DC | PRN
  Administered 2014-08-06: 10 mg via INTRAVENOUS

## 2014-08-06 MED ORDER — ONDANSETRON HCL 4 MG/2ML IJ SOLN: 4.0000 mg | Freq: Four times a day (QID) | INTRAMUSCULAR | Status: DC | PRN

## 2014-08-06 MED ORDER — NICOTINE 14 MG/24HR TD PT24
14.0000 mg | MEDICATED_PATCH | Freq: Every day | TRANSDERMAL | Status: DC
  Administered 2014-08-06 – 2014-08-13 (×8): 14 mg via TRANSDERMAL
  Filled 2014-08-06 (×8): qty 1

## 2014-08-06 MED ORDER — HYDRALAZINE HCL 20 MG/ML IJ SOLN
10.0000 mg | Freq: Once | INTRAMUSCULAR | Status: AC
  Administered 2014-08-06: 10 mg via INTRAVENOUS

## 2014-08-06 MED ORDER — ONDANSETRON HCL 4 MG PO TABS: 4.0000 mg | ORAL_TABLET | Freq: Four times a day (QID) | ORAL | Status: DC | PRN

## 2014-08-06 NOTE — Progress Notes (Signed)
Patient ID: Trevor [name], male   DOB: 06/07/1965, 49 y.o.   MRN: [id number]  LOS: 2 days POD#0  Subjective: No unexpected c/o. Denies N/V/flatus.   Objective: Vital signs in last 24 hours: Temp:  [97 F (36.1 C)-98.2 F (36.8 C)] 98.2 F (36.8 C) (07/19 0451) Pulse Rate:  [62-109] 109 (07/19 0451) Resp:  [11-32] 15 (07/19 0451) BP: (141-199)/(80-117) 141/82 mmHg (07/19 0451) SpO2:  [91 %-100 %] 96 % (07/19 0451) Weight:  [113.172 kg (249 lb 8 oz)] 113.172 kg (249 lb 8 oz) (07/19 0118) Last BM Date: 08/05/14   JP: 26138ml/insertion   Laboratory  CBC  Recent Labs  08/06/14 0028 08/06/14 0400  WBC 14.7* 13.3*  HGB 15.0 13.4  HCT 43.5 39.2  PLT 176 191   BMET  Recent Labs  08/06/14 0028 08/06/14 0400  NA  --  137  K  --  3.8  CL  --  106  CO2  --  22  GLUCOSE  --  194*  BUN  --  7  CREATININE 1.15 1.13  CALCIUM  --  7.8*   CBG (last 3)   Recent Labs  08/05/14 2343 08/06/14 0321  GLUCAP 126* 180*    Physical Exam General appearance: alert and no distress Resp: clear to auscultation bilaterally Cardio: Mild tachycardia GI: Soft, absent BS, VAC in place   Assessment/Plan: Fall SB perf s/p ex lap, SBR -- VAC change tomorrow, continues with ileus Lumbar TVP fxs -- PT/OT Multiple medical problems -- Home meds FEN -- VTE -- SCD's, Lovenox (increase for weight) Dispo -- Ileus    Freeman CaldronMichael J. Nyeisha Goodall, PA-C Pager: [phone] General Trauma PA Pager: [phone]  08/06/2014

## 2014-08-06 NOTE — Progress Notes (Signed)
Initial Nutrition Assessment  DOCUMENTATION CODES:   Obesity unspecified  INTERVENTION:   Diet advancement as medically appropriate.  NUTRITION DIAGNOSIS:   Inadequate oral intake related to inability to eat as evidenced by NPO status.  GOAL:   Patient will meet greater than or equal to 90% of their needs  MONITOR:   Diet advancement, Weight trends, Labs, I & O's  REASON FOR ASSESSMENT:   Malnutrition Screening Tool    ASSESSMENT:   49 yo male with multiple medical problems who presents about 12-13 hours s/p a fall about 12 feet off a ladder. He landed on his back on a couple of bags of concrete. The spine cuts showed possible free intraperitoneal and retroperitoneal air. PMH DM, HTN, hep C.  Procedure performed (7/18): Exploratory laparotomy, small bowel resection, placement of pelvic drain, placement of abdominal wound VAC 20 cm2  Pt is currently NPO as pt with ileus. [facility] Pt reports his abdomen is feeling very sore. PTA pt reports eating well with 2-3 meals daily. Usual body weight reported to be 254 lbs. RD to order supplements if po intake is poor once diet advances.   Pt with no observed significant fat or muscle mass loss.   Labs and medications reviewed.   Diet Order:  Diet NPO time specified Except for: Ice Chips, Sips with Meds  Skin:  Wound (see comment) (Incision on abdomen with VAC)  Last BM:  7/18  Height:   Ht Readings from Last 1 Encounters:  08/06/14 5\' 7"  (1.702 m)    Weight:   Wt Readings from Last 1 Encounters:  08/06/14 249 lb 8 oz (113.172 kg)    Ideal Body Weight:  67 kg  Wt Readings from Last 10 Encounters:  08/06/14 249 lb 8 oz (113.172 kg)  07/18/14 247 lb (112.038 kg)  06/19/14 250 lb (113.399 kg)  01/06/11 170 lb (77.111 kg)    BMI:  Body mass index is 39.07 kg/(m^2).  Estimated Nutritional Needs:   Kcal:  2100-2300  Protein:  115-135 grams  Fluid:  2.1- 2.3 L/day  EDUCATION NEEDS:    No education needs identified at this time  Roslyn SmilingStephanie Dyllan Kats, MS, RD, LDN Pager # [phone] After hours/ weekend pager # [phone]

## 2014-08-06 NOTE — Progress Notes (Signed)
Patient asked RN could she go visit a [place] [address] [facility]. Nurse paged doctor who stated patient was not able to leave unit. RN told patient she was not able to leave. Patient left after being instructed not to. RN paged doctor about patient leaving floor

## 2014-08-06 NOTE — Progress Notes (Signed)
Patient ended up voiding, so the catheter was not needed. Will monitor for any more urine retention.

## 2014-08-06 NOTE — Progress Notes (Signed)
D; pt. Pulled out NGT, attempted to put it back, pt vehemently refused  A; notified MD, okay to leave it out.

## 2014-08-06 NOTE — Evaluation (Signed)
Occupational Therapy Evaluation Patient Details Name: Trevor MewRobert L [name] MRN: [id number] DOB: 10/31/1965 Today's Date: 08/06/2014    History of Present Illness     Clinical Impression   Pt admitted with above. Pt independent with ADLs, PTA. Feel pt will benefit from acute OT to increase independence prior to d/c.     Follow Up Recommendations  Home health OT;Supervision - Intermittent    Equipment Recommendations  Tub/shower bench;3 in 1 bedside comode;Other (comment) (AE)    Recommendations for Other Services       Precautions / Restrictions Precautions Precautions: Back;Fall Precaution Comments: educated on back precautions Restrictions Weight Bearing Restrictions: No      Mobility Bed Mobility Overal bed mobility: Needs Assistance Bed Mobility: Sit to Sidelying;Rolling Rolling: Min assist       Sit to sidelying: Max assist General bed mobility comments: cues for technique.   Transfers Overall transfer level: Needs assistance   Transfers: Sit to/from Stand Sit to Stand: Min assist         General transfer comment: assist to boost from bed. Cue for hand placement.    Balance  Used walker for ambulation-Min guard. No LOB in session.                                          ADL Overall ADL's : Needs assistance/impaired                     Lower Body Dressing: Maximal assistance;Sit to/from stand   Toilet Transfer: Min guard;Ambulation;Minimal assistance;RW (Min assist for sit to stand transfer from bed)           Functional mobility during ADLs: Min guard;Rolling walker General ADL Comments: Educated that AE is available for LB ADLs as pt was unable to cross legs today. Educated on what pt could use for toilet aide as he reports difficulty with this at home. Pt NPO with exception to ice.      Vision     Perception     Praxis      Pertinent Vitals/Pain Pain Assessment: 0-10 Pain Score: 10-Worst pain ever Pain  Location: stomach, groin, and back Pain Descriptors / Indicators: Burning;Other (Comment) (pulling) Pain Intervention(s): Monitored during session;Repositioned;Limited activity within patient's tolerance (gave him PCA to use)     Hand Dominance     Extremity/Trunk Assessment Upper Extremity Assessment Upper Extremity Assessment: Overall WFL for tasks assessed   Lower Extremity Assessment Lower Extremity Assessment: Defer to PT evaluation       Communication Communication Communication: No difficulties   Cognition Arousal/Alertness: Awake/alert Behavior During Therapy: WFL for tasks assessed/performed Overall Cognitive Status: Within Functional Limits for tasks assessed                     General Comments       Exercises       Shoulder Instructions      Home Living Family/patient expects to be discharged to:: Private residence Living Arrangements: Parent (helps his mom at home) Available Help at Discharge: Friend(s);Available PRN/intermittently Type of Home: House Home Access: Stairs to enter Entergy CorporationEntrance Stairs-Number of Steps: 4 Entrance Stairs-Rails: None Home Layout: One level     Bathroom Shower/Tub: Tub/shower unit         Home Equipment: Environmental consultantWalker - 2 wheels (unsure if he has 3 in 1)  Prior Functioning/Environment Level of Independence: Independent             OT Diagnosis: Acute pain   OT Problem List: Decreased range of motion;Decreased activity tolerance;Decreased knowledge of use of DME or AE;Decreased knowledge of precautions;Pain   OT Treatment/Interventions: Self-care/ADL training;DME and/or AE instruction;Therapeutic activities;Patient/family education;Balance training    OT Goals(Current goals can be found in the care plan section) Acute Rehab OT Goals Patient Stated Goal: to pee OT Goal Formulation: With patient Time For Goal Achievement: 08/13/14 Potential to Achieve Goals: Good ADL Goals Pt Will Perform Lower Body  Bathing: with set-up;with adaptive equipment;sit to/from stand Pt Will Perform Lower Body Dressing: with set-up;with adaptive equipment;sit to/from stand Pt Will Transfer to Toilet: with supervision;ambulating Pt Will Perform Toileting - Clothing Manipulation and hygiene: with set-up;with adaptive equipment;sit to/from stand Pt Will Perform Tub/Shower Transfer: Tub transfer;with supervision;ambulating;rolling walker (3 in 1 versus tub bench) Additional ADL Goal #1: Pt will perform bed mobility at supervision level as precursor for ADLs.  OT Frequency: Min 2X/week   Barriers to D/C:            Co-evaluation              End of Session Equipment Utilized During Treatment: Gait belt;Rolling walker Nurse Communication: Mobility status;Other (comment) (pain; wanting ice; d/c recommendation)  Activity Tolerance: Patient limited by pain Patient left: in bed;with call bell/phone within reach   Time: 1610-9604 OT Time Calculation (min): 17 min Charges:  OT General Charges $OT Visit: 1 Procedure OT Evaluation $Initial OT Evaluation Tier I: 1 Procedure G-CodesEarlie Raveling OTR/L [phone] 08/06/2014, 4:49 PM

## 2014-08-07 LAB — GLUCOSE, CAPILLARY
GLUCOSE-CAPILLARY: 107 mg/dL — AB (ref 65–99)
Glucose-Capillary: 129 mg/dL — ABNORMAL HIGH (ref 65–99)
Glucose-Capillary: 164 mg/dL — ABNORMAL HIGH (ref 65–99)
Glucose-Capillary: 161 mg/dL — ABNORMAL HIGH (ref 65–99)
Glucose-Capillary: 148 mg/dL — ABNORMAL HIGH (ref 65–99)

## 2014-08-07 MED ORDER — HYDROMORPHONE HCL 1 MG/ML IJ SOLN
0.5000 mg | INTRAMUSCULAR | Status: DC | PRN
  Administered 2014-08-07 – 2014-08-08 (×4): 1 mg via INTRAVENOUS
  Filled 2014-08-07 (×4): qty 1

## 2014-08-07 MED ORDER — HYDRALAZINE HCL 20 MG/ML IJ SOLN
10.0000 mg | Freq: Once | INTRAMUSCULAR | Status: AC
  Administered 2014-08-07: 10 mg via INTRAVENOUS
  Filled 2014-08-07: qty 1

## 2014-08-07 MED ORDER — HYDRALAZINE HCL 20 MG/ML IJ SOLN
5.0000 mg | Freq: Once | INTRAMUSCULAR | Status: AC
  Administered 2014-08-07: 5 mg via INTRAVENOUS
  Filled 2014-08-07: qty 1

## 2014-08-07 NOTE — Evaluation (Signed)
Physical Therapy Evaluation Patient Details Name: Trevor MewRobert L Gonce MRN: [id number] DOB: 08/12/1965 Today's Date: 08/07/2014   History of Present Illness  49 yo male with multiple medical problems who presents about 12-13 hours s/p a fall about 12 feet off a ladder.SB perf s/p ex lap, SBR. Lumbar TVP fxs.  Clinical Impression  Patient demonstrates deficits in functional mobility as indicated below. Will need continued skilled PT to address deficits and maximize function. Will see as indicated and progress as tolerated. Patient able to ambulate with RW despite pain. OF NOTE: Patient with poor ability to take deep breath or use IS. Encouraged coughing with pillow splint and OOB to chair often with staff.    Follow Up Recommendations Home health PT;Supervision/Assistance - 24 hour    Equipment Recommendations  Rolling walker with 5" wheels    Recommendations for Other Services       Precautions / Restrictions Precautions Precautions: Back;Fall (wound vac in place) Precaution Comments: educated on back precautions Restrictions Weight Bearing Restrictions: No      Mobility  Bed Mobility Overal bed mobility: Needs Assistance Bed Mobility: Sit to Sidelying;Rolling Rolling: Min assist       Sit to sidelying: Max assist General bed mobility comments: cues for technique.   Transfers Overall transfer level: Needs assistance Equipment used: Rolling walker (2 wheeled) Transfers: Sit to/from Stand Sit to Stand: Min assist         General transfer comment: assist to boost from bed. Cue for hand placement.  Ambulation/Gait Ambulation/Gait assistance: Min guard Ambulation Distance (Feet): 30 Feet Assistive device: Rolling walker (2 wheeled) Gait Pattern/deviations: Step-through pattern;Decreased stride length;Trunk flexed Gait velocity: decreased Gait velocity interpretation: Below normal speed for age/gender General Gait Details: very cautious with movement  Stairs            Wheelchair Mobility    Modified Rankin (Stroke Patients Only)       Balance     Sitting balance-Leahy Scale: Fair     Standing balance support: Bilateral upper extremity supported Standing balance-Leahy Scale: Poor Standing balance comment: reliance on RW for support                             Pertinent Vitals/Pain Pain Assessment: 0-10 Pain Score: 9  Pain Location: abdominal region Pain Descriptors / Indicators: Burning;Constant;Sore Pain Intervention(s): Limited activity within patient's tolerance;Monitored during session;Repositioned;PCA encouraged    Home Living Family/patient expects to be discharged to:: Private residence Living Arrangements: Parent (helps his mom at home) Available Help at Discharge: Friend(s);Available PRN/intermittently Type of Home: House Home Access: Stairs to enter Entrance Stairs-Rails: None Entrance Stairs-Number of Steps: 4 Home Layout: One level Home Equipment: Walker - 2 wheels (unsure if he has 3 in 1)      Prior Function Level of Independence: Independent               Hand Dominance        Extremity/Trunk Assessment   Upper Extremity Assessment: Overall WFL for tasks assessed           Lower Extremity Assessment: Overall WFL for tasks assessed      Cervical / Trunk Assessment:  (wound Vac in place)  Communication   Communication: No difficulties  Cognition Arousal/Alertness: Awake/alert Behavior During Therapy: WFL for tasks assessed/performed Overall Cognitive Status: Within Functional Limits for tasks assessed  General Comments      Exercises        Assessment/Plan    PT Assessment Patient needs continued PT services  PT Diagnosis Difficulty walking;Acute pain   PT Problem List Decreased strength;Decreased range of motion;Decreased activity tolerance;Decreased balance;Decreased mobility;Pain;Decreased knowledge of precautions;Cardiopulmonary status  limiting activity  PT Treatment Interventions DME instruction;Gait training;Stair training;Functional mobility training;Therapeutic activities;Therapeutic exercise;Balance training;Patient/family education   PT Goals (Current goals can be found in the Care Plan section) Acute Rehab PT Goals Patient Stated Goal: to not hurt so much PT Goal Formulation: With patient Time For Goal Achievement: 08/21/14 Potential to Achieve Goals: Good    Frequency Min 3X/week   Barriers to discharge        Co-evaluation               End of Session Equipment Utilized During Treatment: Oxygen Activity Tolerance: Patient limited by pain Patient left: in chair;with call bell/phone within reach Nurse Communication: Mobility status;Precautions         Time: 2841-3244 PT Time Calculation (min) (ACUTE ONLY): 21 min   Charges:   PT Evaluation $Initial PT Evaluation Tier I: 1 Procedure     PT G CodesFabio Asa 08-11-14, 5:53 PM Charlotte Crumb, PT DPT  [phone]

## 2014-08-07 NOTE — Consult Note (Signed)
WOC wound consult note Reason for Consult: Consult requested to change abd Vac dressing; CCS following for assessment and plan of care. Wound type: Full thickness post-op wound to midline abd Measurement: 12X3X2cm Wound bed: Beefy red Drainage (amount, consistency, odor) Small amt bleeding when dressing changed Periwound: Intact skin surrounding  Dressing procedure/placement/frequency: Applied one piece of black sponge to 125mm cont suction.  Pt tolerated with mod amt discomfort. Plan for bedside nurse to change Q M/W/F.  Discussed plan of care with patient and he verbalized understanding. Please re-consult if further assistance is needed.  Thank-you,  Cammie Mcgeeawn Bowden Boody MSN, RN, CWOCN, EvermanWCN-AP, CNS [phone]

## 2014-08-07 NOTE — Progress Notes (Signed)
Patient ID: Trevor [name], male   DOB: 02/02/1965, 49 y.o.   Roxanna MewMRN: [id number]   LOS: 1 day   POD#1  Subjective: Sore, not getting much sleep, but wants to stay with PCA for now. Denies N/V/flatus.   Objective: Vital signs in last 24 hours: Temp:  [98 F (36.7 C)-98.6 F (37 C)] 98.6 F (37 C) (07/20 0119) Pulse Rate:  [95-115] 99 (07/20 0119) Resp:  [16-27] 19 (07/20 0435) BP: (152-180)/(80-111) 180/111 mmHg (07/20 0330) SpO2:  [90 %-100 %] 100 % (07/20 0435) Last BM Date: 08/05/14   Laboratory Results CBG (last 3)   Recent Labs  08/06/14 2339 08/07/14 0400 08/07/14 0758  GLUCAP 181* 129* 164*    Physical Exam General appearance: alert and no distress Resp: clear to auscultation bilaterally Cardio: regular rate and rhythm GI: Soft, diminished BS, appropriately TTP, VAC in place though RN reports leaking   Assessment/Plan: Fall SB perf s/p ex lap, SBR -- VAC change today, continues with ileus Lumbar TVP fxs -- PT/OT Multiple medical problems -- Home meds, Metformin to restart tomorrow FEN -- No issues VTE -- SCD's, Lovenox Dispo -- Ileus    Trevor CaldronMichael J. Zayon Trulson, PA-C Pager: [phone] General Trauma PA Pager: [phone]  08/07/2014

## 2014-08-07 NOTE — Progress Notes (Signed)
PCA was discontinued.  I wasted 14ml in the sink. Witnessed by Kathyrn SheriffNancy C.

## 2014-08-07 NOTE — Progress Notes (Addendum)
Occupational Therapy Treatment Patient Details Name: Trevor MewRobert L Budney MRN: [id number] DOB: 06/11/1965 Today's Date: 08/07/2014    History of present illness 49 yo male with multiple medical problems who presents about 12-13 hours s/p a fall about 12 feet off a ladder.SB perf s/p ex lap, SBR. Lumbar TVP fxs.   OT comments  Pt progressing towards acute OT goals. Pt limited by 10/10 lower abdominal and back pain and voiding urgency with no/limited output. Pt ambulated 6 feet in-room at min A level with rw to attempt toilet transfer as detailed below. ADL/AE education provided as detailed below. D/c plan remains appropriate.   Follow Up Recommendations  Home health OT;Supervision - Intermittent    Equipment Recommendations  Tub/shower bench;3 in 1 bedside comode;Other (comment)    Recommendations for Other Services      Precautions / Restrictions Precautions Precautions: Back;Fall Precaution Comments: educated on back precautions Restrictions Weight Bearing Restrictions: No       Mobility Bed Mobility               General bed mobility comments: in recliner  Transfers Overall transfer level: Needs assistance Equipment used: Rolling walker (2 wheeled) Transfers: Sit to/from Stand Sit to Stand: Min assist         General transfer comment: assist during power up from recliner and to control speed of decent.     Balance Overall balance assessment: Needs assistance Sitting-balance support: No upper extremity supported;Feet supported Sitting balance-Leahy Scale: Fair     Standing balance support: Bilateral upper extremity supported;During functional activity Standing balance-Leahy Scale: Poor Standing balance comment: rw for support. Pt with wide BOS in standing.                   ADL Overall ADL's : Needs assistance/impaired                                     Functional mobility during ADLs: Min guard;Rolling walker (6 steps) General ADL  Comments: Pt attempted in-room ambulation to BSC at min guard level. Pt ambulated 6 steps before indicating he needed to sit down due to back pain and urine voiding urgency, Reviewed ADL education. Pt has reacher, sock aid, long-handled shoe horn, and long handled sponge. Educated in AE use.       Vision                     Perception     Praxis      Cognition   Behavior During Therapy: WFL for tasks assessed/performed Overall Cognitive Status: Within Functional Limits for tasks assessed                       Extremity/Trunk Assessment               Exercises     Shoulder Instructions       General Comments      Pertinent Vitals/ Pain       Pain Assessment: 0-10 Pain Score: 10-Worst pain ever Pain Location: stomach and back Pain Descriptors / Indicators: Sore Pain Intervention(s): Limited activity within patient's tolerance;Monitored during session;Repositioned;PCA encouraged  Home Living  Prior Functioning/Environment              Frequency Min 2X/week     Progress Toward Goals  OT Goals(current goals can now be found in the care plan section)  Progress towards OT goals: Progressing toward goals  Acute Rehab OT Goals Patient Stated Goal: to pee OT Goal Formulation: With patient Time For Goal Achievement: 08/13/14 Potential to Achieve Goals: Good ADL Goals Pt Will Perform Lower Body Bathing: with set-up;with adaptive equipment;sit to/from stand Pt Will Perform Lower Body Dressing: with set-up;with adaptive equipment;sit to/from stand Pt Will Transfer to Toilet: with supervision;ambulating Pt Will Perform Toileting - Clothing Manipulation and hygiene: with set-up;with adaptive equipment;sit to/from stand Pt Will Perform Tub/Shower Transfer: Tub transfer;with supervision;ambulating;rolling walker Additional ADL Goal #1: Pt will perform bed mobility at supervision level as  precursor for ADLs.  Plan Discharge plan remains appropriate    Co-evaluation                 End of Session Equipment Utilized During Treatment: Gait belt;Rolling walker;Oxygen   Activity Tolerance Patient limited by pain;Other (comment) (urinary urgency)   Patient Left in chair;with call bell/phone within reach;Other (comment) (with PCA within reach)   Nurse Communication Other (comment) (IV status; urinary urgency with no/limited voiding)        Time: 1610-9604 OT Time Calculation (min): 27 min  Charges: OT General Charges $OT Visit: 1 Procedure OT Treatments $Self Care/Home Management : 23-37 mins  Pilar Grammes 08/07/2014, 10:25 AM

## 2014-08-07 NOTE — Progress Notes (Signed)
Noted PT/OT recommending [facility] services. Unfortunately, pt has Medicaid only, and does not have qualifying diagnosis for HHPT/OT.   Medicaid does cover DME needs.  Met with pt; he states he lives with 50 yo mother, who can assist "some", and has a brother and nephew nearby, who can help, if needed.   Will continue to follow progress.    Reinaldo Raddle, RN, BSN  Trauma/Neuro ICU Case Manager [phone]

## 2014-08-08 ENCOUNTER — Encounter (HOSPITAL_COMMUNITY): Payer: Self-pay | Admitting: General Practice

## 2014-08-08 ENCOUNTER — Inpatient Hospital Stay (HOSPITAL_COMMUNITY): Payer: Medicaid Other

## 2014-08-08 DIAGNOSIS — I10 Essential (primary) hypertension: Secondary | ICD-10-CM | POA: Diagnosis present

## 2014-08-08 DIAGNOSIS — E7800 Pure hypercholesterolemia, unspecified: Secondary | ICD-10-CM | POA: Insufficient documentation

## 2014-08-08 LAB — GLUCOSE, CAPILLARY
GLUCOSE-CAPILLARY: 126 mg/dL — AB (ref 65–99)
GLUCOSE-CAPILLARY: 127 mg/dL — AB (ref 65–99)
Glucose-Capillary: 108 mg/dL — ABNORMAL HIGH (ref 65–99)
Glucose-Capillary: 127 mg/dL — ABNORMAL HIGH (ref 65–99)
Glucose-Capillary: 130 mg/dL — ABNORMAL HIGH (ref 65–99)
Glucose-Capillary: 138 mg/dL — ABNORMAL HIGH (ref 65–99)
Glucose-Capillary: 141 mg/dL — ABNORMAL HIGH (ref 65–99)

## 2014-08-08 MED ORDER — HYDROMORPHONE HCL 1 MG/ML IJ SOLN
1.0000 mg | INTRAMUSCULAR | Status: DC | PRN
  Administered 2014-08-08 – 2014-08-09 (×2): 2 mg via INTRAVENOUS
  Administered 2014-08-09: 1 mg via INTRAVENOUS
  Administered 2014-08-09 – 2014-08-10 (×10): 2 mg via INTRAVENOUS
  Administered 2014-08-10: 1 mg via INTRAVENOUS
  Administered 2014-08-11: 2 mg via INTRAVENOUS
  Administered 2014-08-11: 1 mg via INTRAVENOUS
  Administered 2014-08-11 (×2): 2 mg via INTRAVENOUS
  Administered 2014-08-11: 1 mg via INTRAVENOUS
  Administered 2014-08-11 – 2014-08-13 (×8): 2 mg via INTRAVENOUS
  Filled 2014-08-08 (×6): qty 2
  Filled 2014-08-08: qty 1
  Filled 2014-08-08 (×5): qty 2
  Filled 2014-08-08: qty 1
  Filled 2014-08-08: qty 2
  Filled 2014-08-08: qty 1
  Filled 2014-08-08 (×9): qty 2
  Filled 2014-08-08: qty 1
  Filled 2014-08-08 (×2): qty 2

## 2014-08-08 MED ORDER — GABAPENTIN 300 MG PO CAPS
300.0000 mg | ORAL_CAPSULE | Freq: Three times a day (TID) | ORAL | Status: DC
  Administered 2014-08-08 – 2014-08-13 (×11): 300 mg via ORAL
  Filled 2014-08-08 (×12): qty 1

## 2014-08-08 MED ORDER — HYDRALAZINE HCL 20 MG/ML IJ SOLN
10.0000 mg | Freq: Once | INTRAMUSCULAR | Status: AC
  Administered 2014-08-08: 10 mg via INTRAVENOUS
  Filled 2014-08-08: qty 1

## 2014-08-08 MED ORDER — HYDRALAZINE HCL 20 MG/ML IJ SOLN
10.0000 mg | Freq: Four times a day (QID) | INTRAMUSCULAR | Status: DC | PRN
  Administered 2014-08-08 – 2014-08-11 (×2): 10 mg via INTRAVENOUS
  Filled 2014-08-08 (×2): qty 1

## 2014-08-08 MED ORDER — HYDROCHLOROTHIAZIDE 12.5 MG PO CAPS
12.5000 mg | ORAL_CAPSULE | Freq: Every day | ORAL | Status: DC
  Administered 2014-08-08 – 2014-08-13 (×5): 12.5 mg via ORAL
  Filled 2014-08-08 (×5): qty 1

## 2014-08-08 MED ORDER — PANTOPRAZOLE SODIUM 40 MG IV SOLR: 40.0000 mg | INTRAVENOUS | Status: DC

## 2014-08-08 MED ORDER — ATORVASTATIN CALCIUM 40 MG PO TABS
40.0000 mg | ORAL_TABLET | Freq: Every day | ORAL | Status: DC
  Administered 2014-08-08 – 2014-08-13 (×5): 40 mg via ORAL
  Filled 2014-08-08 (×5): qty 1

## 2014-08-08 MED ORDER — LISINOPRIL 40 MG PO TABS
40.0000 mg | ORAL_TABLET | Freq: Every day | ORAL | Status: DC
  Administered 2014-08-10 – 2014-08-13 (×4): 40 mg via ORAL
  Filled 2014-08-08: qty 2
  Filled 2014-08-08: qty 1
  Filled 2014-08-08: qty 2
  Filled 2014-08-08: qty 1

## 2014-08-08 MED ORDER — HYDROMORPHONE HCL 1 MG/ML IJ SOLN
1.0000 mg | INTRAMUSCULAR | Status: DC | PRN
  Administered 2014-08-08 (×3): 1 mg via INTRAVENOUS
  Filled 2014-08-08 (×4): qty 1

## 2014-08-08 MED ORDER — OXYCODONE HCL 5 MG PO TABS
5.0000 mg | ORAL_TABLET | ORAL | Status: DC | PRN
  Administered 2014-08-08 – 2014-08-12 (×8): 15 mg via ORAL
  Filled 2014-08-08 (×8): qty 3

## 2014-08-08 MED ORDER — METOPROLOL TARTRATE 1 MG/ML IV SOLN
10.0000 mg | Freq: Three times a day (TID) | INTRAVENOUS | Status: DC
  Administered 2014-08-08 – 2014-08-13 (×15): 10 mg via INTRAVENOUS
  Filled 2014-08-08 (×15): qty 10

## 2014-08-08 MED ORDER — METFORMIN HCL ER 500 MG PO TB24: 500.0000 mg | ORAL_TABLET | Freq: Every evening | ORAL | Status: DC

## 2014-08-08 MED ORDER — METOPROLOL SUCCINATE ER 100 MG PO TB24
100.0000 mg | ORAL_TABLET | Freq: Every day | ORAL | Status: DC
  Administered 2014-08-08: 100 mg via ORAL
  Filled 2014-08-08: qty 1

## 2014-08-08 NOTE — Progress Notes (Signed)
Patient ID: Roxanna Mew, male   DOB: 1965-08-11, 49 y.o.   MRN: [id number]   LOS: 2 days   POD#2  Subjective: C/o hiccups, eructation. Agreeable to NGT now. Denies N/V for most part. No flatus. Needs pain meds.   Objective: Vital signs in last 24 hours: Temp:  [98.1 F (36.7 C)-98.8 F (37.1 C)] 98.1 F (36.7 C) (07/21 0540) Pulse Rate:  [71-83] 71 (07/21 0540) Resp:  [17-19] 19 (07/21 0540) BP: (166-190)/(109-112) 166/110 mmHg (07/21 0540) SpO2:  [98 %] 98 % (07/21 0540) Last BM Date: 08/05/14   Laboratory Results CBG (last 3)   Recent Labs  08/07/14 2355 08/08/14 0331 08/08/14 0731  GLUCAP 138* 126* 127*    Physical Exam General appearance: alert and no distress Resp: clear to auscultation bilaterally Cardio: regular rate and rhythm GI: Soft, diminished BS   Assessment/Plan: Fall SB perf s/p ex lap, SBR -- Continues with ileus, place NGT Lumbar TVP fxs -- PT/OT HTN -- Increase ACEI and BB, add diuretic Multiple medical problems -- Home meds, Metformin to restart  FEN -- No issues VTE -- SCD's, Lovenox Dispo -- Ileus    Freeman Caldron, PA-C Pager: [phone] General Trauma PA Pager: [phone]  08/08/2014

## 2014-08-08 NOTE — Progress Notes (Signed)
I called Dr. Dwain Sarna about the patient's high blood pressure.  He gave me an order for IV  Hydralizine. B/P has gone down from 190/110 to 166/110. Continue to monitor.

## 2014-08-09 LAB — GLUCOSE, CAPILLARY
Glucose-Capillary: 115 mg/dL — ABNORMAL HIGH (ref 65–99)
Glucose-Capillary: 130 mg/dL — ABNORMAL HIGH (ref 65–99)
Glucose-Capillary: 136 mg/dL — ABNORMAL HIGH (ref 65–99)
Glucose-Capillary: 91 mg/dL (ref 65–99)
Glucose-Capillary: 93 mg/dL (ref 65–99)
Glucose-Capillary: 96 mg/dL (ref 65–99)

## 2014-08-09 NOTE — Anesthesia Postprocedure Evaluation (Signed)
Anesthesia Post Note  Patient: Trevor [name]  Procedure(s) Performed: Procedure(s) (LRB): EXPLORATORY LAPAROTOMY, SMALL BOWEL RESECTION (N/A)  Anesthesia type: General  Patient location: PACU  Post pain: Pain level controlled and Adequate analgesia  Post assessment: Post-op Vital signs reviewed, Patient's Cardiovascular Status Stable, Respiratory Function Stable, Patent Airway and Pain level controlled  Last Vitals:  Filed Vitals:   08/09/14 0654  BP: 142/95  Pulse: 71  Temp: 36.6 C  Resp: 18    Post vital signs: Reviewed and stable  Level of consciousness: awake, alert  and oriented  Complications: No apparent anesthesia complications

## 2014-08-09 NOTE — Progress Notes (Signed)
Physical Therapy Treatment Patient Details Name: Trevor [name] MRN: [id number] DOB: 1965-09-30 Today's Date: 08/09/2014    History of Present Illness 49 yo male with multiple medical problems who presents about 12-13 hours s/p a fall about 12 feet off a ladder.SB perf s/p ex lap, SBR. Lumbar TVP fxs.    PT Comments    Good progress towards functional goals today. Ambulating slowly at a supervision level up to 190 feet, using a rolling walker for support. Safely completed stair training. Still complains of abdominal pain, and having difficulty urinating. Will continue to progress until discharge.  Follow Up Recommendations  Home health PT;Supervision/Assistance - 24 hour     Equipment Recommendations  Rolling walker with 5" wheels    Recommendations for Other Services       Precautions / Restrictions Precautions Precautions: Back;Fall (wound vac in place) Precaution Comments: educated on back precautions for comfort Restrictions Weight Bearing Restrictions: No    Mobility  Bed Mobility Overal bed mobility: Needs Assistance Bed Mobility: Rolling;Sidelying to Sit Rolling: Supervision Sidelying to sit: Supervision       General bed mobility comments: Supervision for safety. VC for log roll technique. Use of rail.  Transfers Overall transfer level: Needs assistance Equipment used: Rolling walker (2 wheeled) Transfers: Sit to/from Stand Sit to Stand: Min guard         General transfer comment: Min guard for safety. VC for hand placement. Performed from bed x2. good control and mechanics with descent. Slow to rise  Ambulation/Gait Ambulation/Gait assistance: Supervision Ambulation Distance (Feet): 190 Feet Assistive device: Rolling walker (2 wheeled) Gait Pattern/deviations: Step-through pattern;Trunk flexed Gait velocity: decreased Gait velocity interpretation: Below normal speed for age/gender General Gait Details: Slow and guarded but steady while using a  rolling walker for support. VC for upright posture. Did not require any rest breaks to complete distance. Assistance to manage lines/leads.   Stairs Stairs: Yes Stairs assistance: Supervision Stair Management: No rails;Step to pattern;Backwards;With walker Number of Stairs: 1 (x2) General stair comments: Educated on safe stair navigation techniques using backwards approach and RW for support. Able to safely perform with supervision. Practiced additonal time and pt states he feels confident when this task. No buckling noted, demos good stability overall.  Wheelchair Mobility    Modified Rankin (Stroke Patients Only)       Balance                                    Cognition Arousal/Alertness: Awake/alert Behavior During Therapy: WFL for tasks assessed/performed Overall Cognitive Status: Within Functional Limits for tasks assessed                      Exercises      General Comments General comments (skin integrity, edema, etc.): States he feels better after ambulating today. Unsuccessful attempt to urinate.      Pertinent Vitals/Pain Pain Assessment: 0-10 Pain Score: 8  Pain Location: abdomen Pain Descriptors / Indicators: Constant Pain Intervention(s): Monitored during session;Repositioned    Home Living                      Prior Function            PT Goals (current goals can now be found in the care plan section) Acute Rehab PT Goals Patient Stated Goal: to not hurt so much PT Goal Formulation: With patient Time For  Goal Achievement: 08/21/14 Potential to Achieve Goals: Good Progress towards PT goals: Progressing toward goals    Frequency  Min 3X/week    PT Plan Current plan remains appropriate    Co-evaluation             End of Session Equipment Utilized During Treatment: Gait belt Activity Tolerance: Patient tolerated treatment well Patient left: in chair;with call bell/phone within reach;with chair alarm  set;Other (comment) (back to intermittent suction)     Time: 1610-9604 PT Time Calculation (min) (ACUTE ONLY): 33 min  Charges:  $Gait Training: 8-22 mins $Therapeutic Activity: 8-22 mins                    G Codes:      Berton Mount 16-Aug-2014, 2:00 PM [facility] [place], [place] [phone]

## 2014-08-09 NOTE — Progress Notes (Signed)
Occupational Therapy Treatment Patient Details Name: Trevor [name] MRN: [id number] DOB: 1965-03-06 Today's Date: 08/09/2014    History of present illness 49 yo male with multiple medical problems who presents about 12-13 hours s/p a fall about 12 feet off a ladder.SB perf s/p ex lap, SBR. Lumbar TVP fxs.   OT comments  This 49 yo male admitted with above presents to acute OT making progress, but needs reinforcement. Will continue to follow to work on BADLs--pt may progress to Mod I prior to D/C.  Follow Up Recommendations  Home health OT;Supervision - Intermittent    Equipment Recommendations  Tub/shower bench;3 in 1 bedside comode;Other (comment)       Precautions / Restrictions Precautions Precautions: Back;Fall (wound vac) Precaution Comments: educated on back precautions for comfort--pt could not recall any of them from prior sessions Restrictions Weight Bearing Restrictions: No       Mobility Bed Mobility Overal bed mobility: Needs Assistance Bed Mobility: Rolling;Sidelying to Sit Rolling: Supervision Sidelying to sit: Supervision       General bed mobility comments: VCs for technique that is better for his back  Transfers Overall transfer level: Needs assistance Equipment used: Rolling walker (2 wheeled) Transfers: Sit to/from Stand Sit to Stand: Supervision         General transfer comment: Min guard for safety. VC for hand placement. Performed from bed x2. good control and mechanics with descent. Slow to rise        ADL Overall ADL's : Needs assistance/impaired                     Lower Body Dressing: Set up;Supervision/safety;Cueing for back precautions;With adaptive equipment;Sit to/from stand Lower Body Dressing Details (indicate cue type and reason): using sock aid Toilet Transfer: Supervision/safety;Ambulation;RW;Comfort height toilet;Cueing for safety Toilet Transfer Details (indicate cue type and reason): legs slightly abducted and  feet externally rotated with one hand on RW and one hand on his thigh                  Vision                 Additional Comments: No change from baseline          Cognition   Behavior During Therapy: WFL for tasks assessed/performed Overall Cognitive Status: Within Functional Limits for tasks assessed       Memory: Decreased recall of precautions                            Pertinent Vitals/ Pain       Pain Assessment: 0-10 Pain Score: 8  Pain Location: abdomen and back Pain Descriptors / Indicators: Aching;Sore;Tightness Pain Intervention(s): Monitored during session;Patient requesting pain meds-RN notified         Frequency Min 2X/week     Progress Toward Goals  OT Goals(current goals can now be found in the care plan section)  Progress towards OT goals: Progressing toward goals (pt has met all goals except for LBB and tub transfers, needs reinforcement to maintain S for all other goals)  Acute Rehab OT Goals Patient Stated Goal: to not hurt so much  Plan Discharge plan remains appropriate       End of Session Equipment Utilized During Treatment: Rolling walker   Activity Tolerance Patient tolerated treatment well (despite pain)   Patient Left in bed;with call bell/phone within reach   Nurse Communication Patient requests pain meds (reported that  it was not quite time and she would bring it as soon as it was, I notified pt)        Time: 1601-1630 OT Time Calculation (min): 29 min  Charges: OT General Charges $OT Visit: 1 Procedure OT Treatments $Self Care/Home Management : 23-37 mins  Trevor [name] [phone] 08/09/2014, 4:39 PM

## 2014-08-09 NOTE — Progress Notes (Signed)
Patient ID: Trevor [name], male   DOB: 1965/02/15, 49 y.o.   MRN: [id number]   LOS: 3 days   POD#3  Subjective: Feels better than yesterday, denies N/V/flatus.   Objective: Vital signs in last 24 hours: Temp:  [97.9 F (36.6 C)-98.3 F (36.8 C)] 97.9 F (36.6 C) (07/22 0654) Pulse Rate:  [71-80] 71 (07/22 0654) Resp:  [16-18] 18 (07/22 0654) BP: (142-186)/(95-107) 142/95 mmHg (07/22 0654) SpO2:  [97 %-100 %] 97 % (07/22 0654) Last BM Date: 08/05/14   NGT: 410ml/24h JP: 28ml/24h   Laboratory  CBG (last 3)   Recent Labs  08/08/14 2334 08/09/14 0322 08/09/14 0733  GLUCAP 130* 130* 115*    Physical Exam General appearance: alert and no distress Resp: clear to auscultation bilaterally Cardio: regular rate and rhythm GI: Soft, +BS, VAC in place   Assessment/Plan: Fall SB perf s/p ex lap, SBR -- Continue NGT Lumbar TVP fxs -- PT/OT HTN -- Elevated Multiple medical problems -- Home meds FEN -- No issues VTE -- SCD's, Lovenox Dispo -- Ileus    Freeman Caldron, PA-C Pager: [phone] General Trauma PA Pager: [phone]  08/09/2014

## 2014-08-09 NOTE — Clinical Social Work Note (Addendum)
CSW received consult for SBIRT, patient states he does not drink alcohol, and only smokes cigarettes.  Patient stated he is looking forward to discharging back home, SBIRT and assessment completed, CSW to sign off, please reconsult if other social work needs arise.  Ervin Knack. Arion Morgan, MSW, Theresia Majors [phone] 08/09/2014 2:16 PM

## 2014-08-09 NOTE — Care Management Note (Signed)
Case Management Note  Patient Details  Name: Trevor [name] MRN: [id number] Date of Birth: Mar 21, 1965  Subjective/Objective:   Pt s/p exploratory lap with small bowel resection on 08/05/14.  PTA, pt independent, resides at home with mother.                   Action/Plan: Pt will need wound VAC for home.  Completed wound VAC insurance authorization forms and faxed to [facility] per protocol.  Pt will need HHRN for wound VAC dressing changes at home; referral to [facility], per pt choice.  Will need order for [facility] for VAC dressing changes prior to dc.  Pt currently with NG tube to suction; will follow progress.    Expected Discharge Date:                  Expected Discharge Plan:  Home w Home Health Services  In-House Referral:     Discharge planning Services  CM Consult  Post Acute Care Choice:    Choice offered to:  Patient  DME Arranged:  Vac DME Agency:  [facility]  HH Arranged:  RN HH Agency:  [facility]  Status of Service:  In process, will continue to follow  Medicare Important Message Given:    Date Medicare IM Given:    Medicare IM give by:    Date Additional Medicare IM Given:    Additional Medicare Important Message give by:     If discussed at Long Length of Stay Meetings, dates discussed:    Additional Comments:  Quintella Baton, RN, BSN  Trauma/Neuro ICU Case Manager [phone]

## 2014-08-09 NOTE — Clinical Social Work Note (Signed)
Clinical Social Work Assessment  Patient Details  Name: Trevor [name] MRN: [id number] Date of Birth: 1965/12/20  Date of referral:  08/09/14               Reason for consult:  Other (Comment Required) (SBIRT)                Permission sought to share information with:    Permission granted to share information::     Name::        Agency::     Relationship::     Contact Information:     Housing/Transportation Living arrangements for the past 2 months:  Single Family Home Source of Information:  Patient Patient Interpreter Needed:  None Criminal Activity/Legal Involvement Pertinent to Current Situation/Hospitalization:  No - Comment as needed Significant Relationships:  Parents Lives with:  Parents Do you feel safe going back to the place where you live?  Yes Need for family participation in patient care:  No (Coment)  Care giving concerns: Patient states he lives with his mother, but he does not have any concerns with needing care.   Social Worker assessment / plan:  CSW met with patient who is a 49 year old male who lives in a house with his mother.  Patient is alert and oriented and on disability.  Patient states he does not drink alcohol, but he does smoke cigarettes.  Patient was able to answer SBIRT questions appropriately, and able to express his feelings.  Patient stated he is looking forward to going back home and will receive home health PT.  Patient stated he was on his room working on the house and then he fell off a ladder on to some concrete.  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  Patient stated he does not have any other questions and he feels like he is making progress to get better.  Patient is hopeful that he will be discharged soon.  Employment status:  Disabled (Comment on whether or not currently receiving Disability) Insurance  information:  Medicaid In [place] PT Recommendations:  Home with [place] / Referral to community resources:     Patient/Family's Response to care:  Patient in agreement to discharging home with home health.  Patient/Family's Understanding of and Emotional Response to Diagnosis, Current Treatment, and Prognosis:  Patient is aware of his diagnosis and current treatment plan.  Emotional Assessment Appearance:  Appears older than stated age Attitude/Demeanor/Rapport:    Affect (typically observed):  Appropriate, Pleasant, Calm Orientation:  Oriented to Self, Oriented to Place, Oriented to  Time, Oriented to Situation Alcohol / Substance use:  Tobacco Use Psych involvement (Current and /or in the community):  No (Comment)  Discharge Needs  Concerns to be addressed:  No discharge needs identified Readmission within the last 30 days:  No Current discharge risk:  None Barriers to Discharge:  No Barriers Identified   Ross Ludwig, LCSWA 08/09/2014, 2:07 PM

## 2014-08-10 ENCOUNTER — Inpatient Hospital Stay (HOSPITAL_COMMUNITY): Payer: Medicaid Other

## 2014-08-10 LAB — GLUCOSE, CAPILLARY
GLUCOSE-CAPILLARY: 96 mg/dL (ref 65–99)
GLUCOSE-CAPILLARY: 96 mg/dL (ref 65–99)
GLUCOSE-CAPILLARY: 95 mg/dL (ref 65–99)
Glucose-Capillary: 107 mg/dL — ABNORMAL HIGH (ref 65–99)
Glucose-Capillary: 103 mg/dL — ABNORMAL HIGH (ref 65–99)

## 2014-08-10 LAB — MAGNESIUM: Magnesium: 1.8 mg/dL (ref 1.7–2.4)

## 2014-08-10 LAB — BASIC METABOLIC PANEL
Anion gap: 8 (ref 5–15)
BUN: 13 mg/dL (ref 6–20)
CHLORIDE: 110 mmol/L (ref 101–111)
CO2: 24 mmol/L (ref 22–32)
CREATININE: 0.85 mg/dL (ref 0.61–1.24)
Calcium: 8.4 mg/dL — ABNORMAL LOW (ref 8.9–10.3)
GFR calc Af Amer: >60 mL/min (ref >60)
GFR calc non Af Amer: >60 mL/min (ref >60)
Glucose, Bld: 114 mg/dL — ABNORMAL HIGH (ref 65–99)
Potassium: 3.3 mmol/L — ABNORMAL LOW (ref 3.5–5.1)
Sodium: 142 mmol/L (ref 135–145)

## 2014-08-10 MED ORDER — POTASSIUM CHLORIDE 10 MEQ/100ML IV SOLN
10.0000 meq | INTRAVENOUS | Status: AC
  Administered 2014-08-10 (×4): 10 meq via INTRAVENOUS
  Filled 2014-08-10 (×4): qty 100

## 2014-08-10 NOTE — Progress Notes (Signed)
Patient ID: Trevor [name], male   DOB: 1965/09/11, 49 y.o.   MRN: [id number]  LOS: 4 days   Subjective: No n/v.  Sore.  No flatus.  Drain 35ml.  NGT out.  Ambulating. BP elevated, but stable.    Objective: Vital signs in last 24 hours: Temp:  [97.7 F (36.5 C)-98.2 F (36.8 C)] 97.7 F (36.5 C) (07/23 0505) Pulse Rate:  [60-78] 60 (07/23 0505) Resp:  [18-20] 19 (07/23 0505) BP: (149-161)/(62-100) 158/100 mmHg (07/23 0505) SpO2:  [93 %-98 %] 96 % (07/23 0505) Last BM Date: 08/05/14  Lab Results:  CBC No results for input(s): WBC, HGB, HCT, PLT in the last 72 hours. BMET  Recent Labs  08/10/14 0324  NA 142  K 3.3*  CL 110  CO2 24  GLUCOSE 114*  BUN 13  CREATININE 0.85  CALCIUM 8.4*    Imaging: Dg Abd Portable 1v  08/08/2014   CLINICAL DATA:  Evaluate NG tube placement.  EXAM: PORTABLE ABDOMEN - 1 VIEW  COMPARISON:  CT 08/05/2014  FINDINGS: NG tube tip and side-port project over the left upper quadrant, likely within the stomach. Multiple gaseous distended loops of bowel are demonstrated. Drainage catheter projects over the pelvis. Lower lumbar spine degenerative changes.  IMPRESSION: NG tube tip and side-port project over the stomach.  Gaseous distended loops of small bowel suggestive of ileus or obstruction.   Electronically Signed   By: Annia Belt M.D.   On: 08/08/2014 12:02     PE: General appearance: alert, cooperative and no distress Resp: clear to auscultation bilaterally Cardio: regular rate and rhythm, S1, S2 normal, no murmur, click, rub or gallop GI: +Bs, abdomen is soft, appropriately tender.  midline wound, VAC in place, serosanguinous output.  NGT with bilious output.  LLQ drain with serosanguinous output.   Extremities: extremities normal, atraumatic, no cyanosis or edema   Patient Active Problem List   Diagnosis Date Noted  . Hypertension   . High cholesterol   . Fall 08/06/2014  . Small intestine injury 08/06/2014  . Closed fracture of  transverse process of lumbar vertebra 08/05/2014  . Syncope 07/18/2014    Assessment/Plan: Fall SB perf s/p ex lap, SBR -- POD#5.  Ileus.  C/w drain.   Lumbar TVP fxs -- PT/OT HTN -- c/w home meds, metoprolol, PRN hydralazine.  Multiple medical problems -- stop metformin until he's taking POs to prevent hypoglycemia.  FEN -- hypokalemia, give x1 now.  IVF VTE -- SCD's, Lovenox Dispo -- Ileus  Ashok Norris, ANP-BC [facility]  08/10/2014 8:18 AM

## 2014-08-10 NOTE — Progress Notes (Signed)
Physical Therapy Treatment Patient Details Name: Trevor [name] MRN: [id number] DOB: May 07, 1965 Today's Date: 08/10/2014    History of Present Illness 49 yo male with multiple medical problems who presents about 12-13 hours s/p a fall about 12 feet off a ladder.SB perf s/p ex lap, SBR. Lumbar TVP fxs.PMhx of DM, HTN, Hepatitis C, MI, wrist surgery,  and knee surgery (did not indicate side for either).    PT Comments    Pt is progressing very well with gait with RW.  Advised him to walk with family during their visits.  RN made aware.  I would like to try to start some walking without RW next session if pt is able.  I do not believe he will need any HHPT f/u as he is progressing so quickly, but he may still need RW-TBD.   PT to follow acutely.   Follow Up Recommendations  No PT follow up     Equipment Recommendations  Rolling walker with 5" wheels;Other (comment) (PT is hopeful to wean from RW prior to d/c, check d/c day)    Recommendations for Other Services   NA     Precautions / Restrictions Precautions Precautions: Back;Fall;Other (comment) (wound vac, NGT) Precaution Comments: Educated on log roll and reverse log roll for into and out of bed to not only help with back, but abdominal pain with transitions as well.     Mobility  Bed Mobility             Sit to sidelying: Supervision General bed mobility comments: Verbal cues for reverse log roll technique. Pt using railing for support.   Transfers Overall transfer level: Needs assistance Equipment used: Rolling walker (2 wheeled) Transfers: Sit to/from Stand Sit to Stand: Modified independent (Device/Increase time)            Ambulation/Gait Ambulation/Gait assistance: Supervision Ambulation Distance (Feet): 500 Feet Assistive device: Rolling walker (2 wheeled) Gait Pattern/deviations: Step-through pattern;Staggering left;Staggering right;Trunk flexed Gait velocity: decreased Gait velocity interpretation:  Below normal speed for age/gender General Gait Details: slow gait speed with flexed trunk. Adjusted RW up for his height and to help with upright posture.  Encouraged upright posture to avoid scaring and decreased ROM in the future.            Balance Overall balance assessment: Needs assistance Sitting-balance support: Feet supported;No upper extremity supported Sitting balance-Leahy Scale: Good     Standing balance support: Bilateral upper extremity supported Standing balance-Leahy Scale: Fair                      Cognition Arousal/Alertness: Awake/alert Behavior During Therapy: WFL for tasks assessed/performed Overall Cognitive Status: Within Functional Limits for tasks assessed                         General Comments General comments (skin integrity, edema, etc.): I advised pt that he could walk with family during their visits.  RN notified.       Pertinent Vitals/Pain Pain Assessment: 0-10 Pain Score: 8  Pain Location: abdomen Pain Descriptors / Indicators: Aching Pain Intervention(s): Limited activity within patient's tolerance;Monitored during session;Premedicated before session;Repositioned           PT Goals (current goals can now be found in the care plan section) Acute Rehab PT Goals Patient Stated Goal: to not hurt so much Progress towards PT goals: Progressing toward goals    Frequency  Min 3X/week    PT Plan Discharge  plan needs to be updated       End of Session   Activity Tolerance: Patient limited by pain Patient left: in bed;with call bell/phone within reach;with family/visitor present     Time: 1610-9604 PT Time Calculation (min) (ACUTE ONLY): 21 min  Charges:  $Gait Training: 8-22 mins                 Aster Eckrich B. Seniya Stoffers, PT, DPT [phone]   08/10/2014, 1:35 PM

## 2014-08-10 NOTE — Progress Notes (Signed)
Occupational Therapy Treatment Patient Details Name: Trevor [name] MRN: [id number] DOB: [birth year] Today's Date: 08/10/2014    History of present illness 49 yo male with multiple medical problems who presents about 12-13 hours s/p a fall about 12 feet off a ladder.SB perf s/p ex lap, SBR. Lumbar TVP fxs.   OT comments  Pt with improved ability to recall back precautions with bed mobility.  Issued reacher, pt aware of multiple uses and very grateful. Pt tolerating activity despite significant abdominal pain.  Follow Up Recommendations  Home health OT;Supervision - Intermittent    Equipment Recommendations  Tub/shower bench;3 in 1 bedside comode;Other (comment)    Recommendations for Other Services      Precautions / Restrictions Precautions Precautions: Back;Fall (wound vac, NG) Precaution Comments: educated in back precautions, able to recall 2/3       Mobility Bed Mobility Overal bed mobility: Needs Assistance Bed Mobility: Rolling;Sidelying to Sit;Sit to Sidelying Rolling: Supervision Sidelying to sit: Supervision     Sit to sidelying: Mod assist General bed mobility comments: verbal reminders for log roll technique  Transfers   Equipment used: Rolling walker (2 wheeled) Transfers: Sit to/from Stand Sit to Stand: Supervision         General transfer comment: moves slowly, but without physical assist from bed    Balance     Sitting balance-Leahy Scale: Fair       Standing balance-Leahy Scale: Poor                     ADL Overall ADL's : Needs assistance/impaired     Grooming: Wash/dry hands;Set up;Sitting                   Toilet Transfer: Radiographer, therapeutic Details (indicate cue type and reason): stood at bedside to use urinal, one hand on RW Toileting- Clothing Manipulation and Hygiene: Supervision/safety;Sit to/from stand         General ADL Comments: Issued reacher, pt able to state its multiple uses.       Vision                     Perception     Praxis      Cognition   Behavior During Therapy: WFL for tasks assessed/performed Overall Cognitive Status: Within Functional Limits for tasks assessed       Memory: Decreased recall of precautions               Extremity/Trunk Assessment               Exercises     Shoulder Instructions       General Comments      Pertinent Vitals/ Pain       Pain Assessment: Faces Faces Pain Scale: Hurts whole lot Pain Location: abdomen Pain Descriptors / Indicators: Aching Pain Intervention(s): Monitored during session;Limited activity within patient's tolerance;Repositioned;Premedicated before session  Home Living                                          Prior Functioning/Environment              Frequency Min 2X/week     Progress Toward Goals  OT Goals(current goals can now be found in the care plan section)  Progress towards OT goals: Progressing toward goals  Acute Rehab OT Goals Patient Stated Goal: to  not hurt so much  Plan Discharge plan remains appropriate    Co-evaluation                 End of Session Equipment Utilized During Treatment: Rolling walker   Activity Tolerance Patient tolerated treatment well   Patient Left in bed;with call bell/phone within reach   Nurse Communication          Time: 4098-1191 OT Time Calculation (min): 14 min  Charges: OT General Charges $OT Visit: 1 Procedure OT Treatments $Self Care/Home Management : 8-22 mins  Evern Bio 08/10/2014, 9:10 AM  [phone]

## 2014-08-11 LAB — GLUCOSE, CAPILLARY
GLUCOSE-CAPILLARY: 104 mg/dL — AB (ref 65–99)
GLUCOSE-CAPILLARY: 134 mg/dL — AB (ref 65–99)
GLUCOSE-CAPILLARY: 146 mg/dL — AB (ref 65–99)
GLUCOSE-CAPILLARY: 79 mg/dL (ref 65–99)
Glucose-Capillary: 94 mg/dL (ref 65–99)
Glucose-Capillary: 122 mg/dL — ABNORMAL HIGH (ref 65–99)

## 2014-08-11 LAB — BASIC METABOLIC PANEL
ANION GAP: 9 (ref 5–15)
BUN: 10 mg/dL (ref 6–20)
CHLORIDE: 101 mmol/L (ref 101–111)
CO2: 26 mmol/L (ref 22–32)
CREATININE: 0.91 mg/dL (ref 0.61–1.24)
Calcium: 8.2 mg/dL — ABNORMAL LOW (ref 8.9–10.3)
Glucose, Bld: 99 mg/dL (ref 65–99)
POTASSIUM: 3 mmol/L — AB (ref 3.5–5.1)
Sodium: 136 mmol/L (ref 135–145)

## 2014-08-11 LAB — CBC
HEMATOCRIT: 35.2 % — AB (ref 39.0–52.0)
HEMOGLOBIN: 12.4 g/dL — AB (ref 13.0–17.0)
MCH: 29.6 pg (ref 26.0–34.0)
MCHC: 35.2 g/dL (ref 30.0–36.0)
MCV: 84 fL (ref 78.0–100.0)
PLATELETS: 215 10*3/uL (ref 150–400)
RBC: 4.19 MIL/uL — ABNORMAL LOW (ref 4.22–5.81)
RDW: 13 % (ref 11.5–15.5)
WBC: 8.6 10*3/uL (ref 4.0–10.5)

## 2014-08-11 LAB — MAGNESIUM: Magnesium: 1.7 mg/dL (ref 1.7–2.4)

## 2014-08-11 MED ORDER — POTASSIUM CHLORIDE CRYS ER 20 MEQ PO TBCR
40.0000 meq | EXTENDED_RELEASE_TABLET | Freq: Three times a day (TID) | ORAL | Status: AC
  Administered 2014-08-11 (×3): 40 meq via ORAL
  Filled 2014-08-11 (×3): qty 2

## 2014-08-11 NOTE — Progress Notes (Signed)
Patient ID: Trevor [name], male   DOB: 1965/06/24, 49 y.o.   MRN: [id number]  LOS: 5 days   Subjective: from NGT.  Passing flatus.  Voiding  Ambulating.  BP up but stable.  Objective: Vital signs in last 24 hours: Temp:  [97.6 F (36.4 C)-99.4 F (37.4 C)] 97.6 F (36.4 C) (07/24 0442) Pulse Rate:  [68-72] 68 (07/24 0442) Resp:  [18-20] 20 (07/24 0442) BP: (149-164)/(84-94) 162/91 mmHg (07/24 0442) SpO2:  [97 %-99 %] 98 % (07/24 0442) Last BM Date: 08/05/14  Lab Results:  CBC  Recent Labs  08/11/14 0508  WBC 8.6  HGB 12.4*  HCT 35.2*  PLT 215   BMET  Recent Labs  08/10/14 0324 08/11/14 0508  NA 142 136  K 3.3* 3.0*  CL 110 101  CO2 24 26  GLUCOSE 114* 99  BUN 13 10  CREATININE 0.85 0.91  CALCIUM 8.4* 8.2*    Imaging: Dg Abd 1 View  08/10/2014   CLINICAL DATA:  Two days postop bowel resection for injury obtained when falling from a ladder from 15 feet. Patient currently complains of bilateral lower quadrant abdominal pain.  EXAM: ABDOMEN - 1 VIEW  COMPARISON:  Portable abdomen x-ray 08/08/2014. CT abdomen and pelvis 08/05/2014.  FINDINGS: Orogastric tube tip in the proximal body of the stomach, unchanged. Interval improvement in the small bowel distention since the examination 2 days ago. Small bowel now of normal caliber. Gas and expected stool burden in normal caliber colon. Surgical drain in the pelvis. No visible opaque urinary tract calculi. Lumbar spine transverse process fractures identified on CT not visible. Severe degenerative changes at L5-S1 as noted previously.  IMPRESSION: Resolution of postoperative small bowel ileus. No acute abdominal abnormality currently.   Electronically Signed   By: Hulan Saas M.D.   On: 08/10/2014 11:58    PE: General appearance: alert, cooperative and no distress Resp: clear to auscultation bilaterally Cardio: regular rate and rhythm, S1, S2 normal, no murmur, click, rub or gallop GI: +Bs, abdomen is soft,  appropriately tender. midline wound, VAC in place, serosanguinous output. NGT with bilious output. LLQ drain with serosanguinous output.  Extremities: extremities normal, atraumatic, no cyanosis or edema   Patient Active Problem List   Diagnosis Date Noted  . Hypertension   . High cholesterol   . Fall 08/06/2014  . Small intestine injury 08/06/2014  . Closed fracture of transverse process of lumbar vertebra 08/05/2014  . Syncope 07/18/2014    Assessment/Plan: Fall SB perf s/p ex lap, SBR -- POD#6. Ileus is resolving. C/w drain. Clamp NGT and allow for clears.  Lumbar TVP fxs -- PT/OT HTN -- c/w home meds, metoprolol, PRN hydralazine.  Multiple medical problems -- stop metformin until he's taking POs to prevent hypoglycemia.  FEN -- hypokalemia, give x3 doses today, IVF until tolerating POs.  Add Mg.  VTE -- SCD's, Lovenox Dispo -- Ileus    Ashok Norris, ANP-BC Pager: [phone] General Trauma PA Pager: [phone]   08/11/2014 8:19 AM

## 2014-08-12 LAB — GLUCOSE, CAPILLARY
GLUCOSE-CAPILLARY: 108 mg/dL — AB (ref 65–99)
GLUCOSE-CAPILLARY: 94 mg/dL (ref 65–99)
GLUCOSE-CAPILLARY: 125 mg/dL — AB (ref 65–99)
Glucose-Capillary: 111 mg/dL — ABNORMAL HIGH (ref 65–99)
Glucose-Capillary: 114 mg/dL — ABNORMAL HIGH (ref 65–99)
Glucose-Capillary: 124 mg/dL — ABNORMAL HIGH (ref 65–99)

## 2014-08-12 LAB — CREATININE, SERUM: CREATININE: 1.08 mg/dL (ref 0.61–1.24)

## 2014-08-12 MED ORDER — ENSURE ENLIVE PO LIQD
237.0000 mL | Freq: Two times a day (BID) | ORAL | Status: DC
  Administered 2014-08-12 – 2014-08-13 (×2): 237 mL via ORAL

## 2014-08-12 MED ORDER — NAPROXEN 250 MG PO TABS
500.0000 mg | ORAL_TABLET | Freq: Two times a day (BID) | ORAL | Status: DC
  Administered 2014-08-12 – 2014-08-13 (×2): 500 mg via ORAL
  Filled 2014-08-12 (×2): qty 2

## 2014-08-12 MED ORDER — OXYCODONE HCL 5 MG PO TABS
10.0000 mg | ORAL_TABLET | ORAL | Status: DC | PRN
  Administered 2014-08-12 (×3): 20 mg via ORAL
  Administered 2014-08-13: 10 mg via ORAL
  Administered 2014-08-13: 20 mg via ORAL
  Filled 2014-08-12 (×4): qty 4
  Filled 2014-08-12: qty 2

## 2014-08-12 NOTE — Progress Notes (Signed)
Occupational Therapy Treatment Patient Details Name: Trevor [name] MRN: [id number] DOB: October 30, 1965 Today's Date: 08/12/2014    History of present illness 49 yo male with multiple medical problems who presents about 12-13 hours s/p a fall about 12 feet off a ladder.SB perf s/p ex lap, SBR. Lumbar TVP fxs.PMhx of DM, HTN, Hepatitis C, MI, wrist surgery,  and knee surgery (did not indicate side for either).   OT comments  Pt making progress towards goals. Pt demonstrated ability to complete tub transfer with no LOB. Pt requesting 3in1 for use in shower at home. Pt able to verbalize and maintain precautions. Pt demonstrated ability to use reacher to assist with LB dressing.   Follow Up Recommendations  Home health OT;Supervision - Intermittent    Equipment Recommendations  3 in 1 bedside comode    Recommendations for Other Services      Precautions / Restrictions Precautions Precautions: Back;Fall Restrictions Weight Bearing Restrictions: No       Mobility Bed Mobility Overal bed mobility: Needs Assistance Bed Mobility: Rolling;Sidelying to Sit Rolling: Supervision Sidelying to sit: Supervision       General bed mobility comments: Pt able to verbalize technique for bed mobility  Transfers Overall transfer level: Needs assistance Equipment used: None Transfers: Sit to/from Stand Sit to Stand: Modified independent (Device/Increase time)         General transfer comment: moves slowly, but without physical assist from bed    Balance Overall balance assessment: No apparent balance deficits (not formally assessed) Sitting-balance support: No upper extremity supported;Feet supported Sitting balance-Leahy Scale: Good     Standing balance support: No upper extremity supported;During functional activity Standing balance-Leahy Scale: Good Standing balance comment: Pt able to maintain balance during tub transfer                   ADL                          Lower Body Dressing Details (indicate cue type and reason): discussed use of AE for LB dressing         Tub/ Shower Transfer: Tub transfer;Min guard;Ambulation;3 in 1   Functional mobility during ADLs: Min guard        Vision                     Perception     Praxis      Cognition   Behavior During Therapy: [facility] for tasks assessed/performed Overall Cognitive Status: Within Functional Limits for tasks assessed                       Extremity/Trunk Assessment               Exercises     Shoulder Instructions       General Comments      Pertinent Vitals/ Pain       Pain Assessment: No/denies pain  Home Living                                          Prior Functioning/Environment              Frequency Min 2X/week     Progress Toward Goals  OT Goals(current goals can now be found in the care plan section)  Progress towards OT goals: Progressing toward  goals  Acute Rehab OT Goals Patient Stated Goal: to go home OT Goal Formulation: With patient Time For Goal Achievement: 08/13/14 Potential to Achieve Goals: Good ADL Goals Pt Will Perform Lower Body Bathing: with set-up;with adaptive equipment;sit to/from stand Pt Will Perform Lower Body Dressing: with set-up;with adaptive equipment;sit to/from stand Pt Will Transfer to Toilet: with supervision;ambulating Pt Will Perform Toileting - Clothing Manipulation and hygiene: with set-up;with adaptive equipment;sit to/from stand Pt Will Perform Tub/Shower Transfer: Tub transfer;with supervision;ambulating;rolling walker Additional ADL Goal #1: Pt will perform bed mobility at supervision level as precursor for ADLs.  Plan Discharge plan remains appropriate    Co-evaluation                 End of Session Equipment Utilized During Treatment: Gait belt   Activity Tolerance Patient tolerated treatment well   Patient Left in bed;with call bell/phone within  reach   Nurse Communication          Time: 1130-1145 OT Time Calculation (min): 15 min  Charges: OT General Charges $OT Visit: 1 Procedure OT Treatments $Self Care/Home Management : 8-22 mins  Marden Noble 08/12/2014, 1:40 PM

## 2014-08-12 NOTE — Progress Notes (Signed)
Nutrition Follow-up  DOCUMENTATION CODES:   Obesity unspecified  INTERVENTION:   Ensure Enlive po BID, each supplement provides 350 kcal and 20 grams of protein  NUTRITION DIAGNOSIS:   Increased nutrient needs related to wound healing as evidenced by estimated needs.  Ongoing  GOAL:   Patient will meet greater than or equal to 90% of their needs  Progressing  MONITOR:   PO intake, Supplement acceptance, Diet advancement, Labs, Weight trends, Skin, I & O's  REASON FOR ASSESSMENT:   Malnutrition Screening Tool    ASSESSMENT:   49 yo male with multiple medical problems who presents about 12-13 hours s/p a fall about 12 feet off a ladder. He landed on his back on a couple of bags of concrete. The spine cuts showed possible free intraperitoneal and retroperitoneal air. PMH DM, HTN, hep C.  Pt s/p procedure on 08/05/14:  Exploratory laparotomy, small bowel resection, placement of pelvic drain, placement of abdominal wound VAC 20 cm2  Reviewed COWRN note on 7/20. Pt with full thickness post-op wound to midline abdomen, connected to wound vac. Pt is due to [facility] dressing change today. 20 ml output documented within the past 24 hours.   NGT was removed on 08/11/14. Per surgery notes, pt had a BM this AM. Pt is currently on a full liquid diet. Spoke with RN, who reports pt is tolerating meals well. Noted 100% meal completion. Will add supplement to optimize nutritional status in light of wound vac.  RNCM following for home health needs; pt will likely d/c with wound vac.   Diet Order:  Diet full liquid Room service appropriate?: Yes; Fluid consistency:: Thin  Skin:  Wound (see comment) (abdominal vac)  Last BM:  08/11/14  Height:   Ht Readings from Last 1 Encounters:  08/06/14  (1.702 m)    Weight:   Wt Readings from Last 1 Encounters:  08/06/14 249 lb 8 oz (113.172 kg)    Ideal Body Weight:  67 kg  Wt Readings from Last 10 Encounters:  08/06/14 249 lb 8 oz  (113.172 kg)  07/18/14 247 lb (112.038 kg)  06/19/14 250 lb (113.399 kg)  01/06/11 170 lb (77.111 kg)    BMI:  Body mass index is 39.07 kg/(m^2).  Estimated Nutritional Needs:   Kcal:  2100-2300  Protein:  115-135 grams  Fluid:  2.1- 2.3 L/day  EDUCATION NEEDS:   No education needs identified at this time  Christphor Groft A. Mayford Knife, RD, LDN, CDE Pager: [phone] After hours Pager: [phone]

## 2014-08-12 NOTE — Progress Notes (Signed)
Patient ID: Trevor [name], male   DOB: [birth year], 49 y.o.   MRN: [id number]   LOS: 6 days   POD#6  Subjective: Had BM yesterday, thinks he can go again today.   Objective: Vital signs in last 24 hours: Temp:  [98 F (36.7 C)-98.7 F (37.1 C)] 98 F (36.7 C) (07/25 0536) Pulse Rate:  [63-77] 77 (07/25 0536) Resp:  [18-19] 18 (07/25 0536) BP: (111-176)/(92-113) 111/92 mmHg (07/25 0536) SpO2:  [97 %-98 %] 98 % (07/25 0918) Last BM Date: 08/05/14   JP: 55ml/24h   Laboratory  BMET  Recent Labs  08/10/14 0324 08/11/14 0508 08/12/14 0530  NA 142 136  --   K 3.3* 3.0*  --   CL 110 101  --   CO2 24 26  --   GLUCOSE 114* 99  --   BUN 13 10  --   CREATININE 0.85 0.91 1.08  CALCIUM 8.4* 8.2*  --    CBG (last 3)   Recent Labs  08/12/14 0025 08/12/14 0412 08/12/14 0748  GLUCAP 114* 108* 94    Physical Exam General appearance: alert and no distress Resp: clear to auscultation bilaterally Cardio: regular rate and rhythm GI: normal findings: bowel sounds normal and soft, non-tender   Assessment/Plan: Fall SB perf s/p ex lap, SBR -- POD#6. Ileus is resolving.Advance to fulls. Lumbar TVP fxs -- PT/OT HTN -- c/w home meds, metoprolol, PRN hydralazine.  Multiple medical problems -- stop metformin until he's taking POs to prevent hypoglycemia.  FEN -- hypokalemia, check BMET in am, increase oral pain meds VTE -- SCD's, Lovenox Dispo -- Ileus    Freeman Caldron, PA-C Pager: [phone] General Trauma PA Pager: [phone]  08/12/2014

## 2014-08-13 LAB — BASIC METABOLIC PANEL
ANION GAP: 7 (ref 5–15)
BUN: 9 mg/dL (ref 6–20)
CHLORIDE: 102 mmol/L (ref 101–111)
CO2: 28 mmol/L (ref 22–32)
Calcium: 8.9 mg/dL (ref 8.9–10.3)
Creatinine, Ser: 1.02 mg/dL (ref 0.61–1.24)
GLUCOSE: 95 mg/dL (ref 65–99)
Potassium: 3.7 mmol/L (ref 3.5–5.1)
Sodium: 137 mmol/L (ref 135–145)

## 2014-08-13 LAB — GLUCOSE, CAPILLARY
GLUCOSE-CAPILLARY: 96 mg/dL (ref 65–99)
Glucose-Capillary: 98 mg/dL (ref 65–99)
Glucose-Capillary: 107 mg/dL — ABNORMAL HIGH (ref 65–99)

## 2014-08-13 MED ORDER — LISINOPRIL 40 MG PO TABS: 40.0000 mg | ORAL_TABLET | Freq: Every day | ORAL | Status: DC

## 2014-08-13 MED ORDER — HYDROCHLOROTHIAZIDE 12.5 MG PO CAPS
12.5000 mg | ORAL_CAPSULE | Freq: Every day | ORAL | Status: DC
Start: 2014-08-13 — End: 2015-02-21

## 2014-08-13 MED ORDER — TRAMADOL HCL 50 MG PO TABS: 100.0000 mg | ORAL_TABLET | Freq: Four times a day (QID) | ORAL | Status: DC

## 2014-08-13 MED ORDER — OXYCODONE-ACETAMINOPHEN 10-325 MG PO TABS: 1.0000 | ORAL_TABLET | ORAL | Status: DC | PRN

## 2014-08-13 MED ORDER — TRAMADOL HCL 50 MG PO TABS
100.0000 mg | ORAL_TABLET | Freq: Four times a day (QID) | ORAL | Status: DC
  Administered 2014-08-13: 100 mg via ORAL
  Filled 2014-08-13: qty 2

## 2014-08-13 NOTE — Progress Notes (Signed)
Patient ID: Roxanna Mew, male   DOB: [birth year], 49 y.o.   MRN: [id number]   LOS: 7 days   POD#7  Subjective: Tolerated regular diet last night. BM this am again.   Objective: Vital signs in last 24 hours: Temp:  [98.2 F (36.8 C)-98.4 F (36.9 C)] 98.2 F (36.8 C) (07/26 0450) Pulse Rate:  [63-77] 65 (07/26 0450) Resp:  [16-18] 16 (07/25 2139) BP: (131-158)/(76-89) 134/84 mmHg (07/26 0450) SpO2:  [95 %-98 %] 98 % (07/26 0806) Last BM Date: 08/12/14   JP: No OP   Laboratory  BMET  Recent Labs  08/11/14 0508 08/12/14 0530 08/13/14 0441  NA 136  --  137  K 3.0*  --  3.7  CL 101  --  102  CO2 26  --  28  GLUCOSE 99  --  95  BUN 10  --  9  CREATININE 0.91 1.08 1.02  CALCIUM 8.2*  --  8.9   CBG (last 3)   Recent Labs  08/13/14 0011 08/13/14 0447 08/13/14 0729  GLUCAP 96 98 107*    Physical Exam General appearance: alert and no distress Resp: clear to auscultation bilaterally Cardio: regular rate and rhythm GI: Soft, appropriately TTP, VAC in place, +BS   Assessment/Plan: Fall SB perf s/p ex lap, SBR -- Tolerated regular diet, d/c JP Lumbar TVP fxs -- PT/OT HTN -- c/w home meds, metoprolol, PRN hydralazine.  Multiple medical problems -- Home meds Dispo -- Home    Freeman Caldron, PA-C Pager: [phone] General Trauma PA Pager: [phone]  08/13/2014

## 2014-08-13 NOTE — Discharge Summary (Signed)
Physician Discharge Summary  Patient ID: Trevor [name] MRN: [id number] DOB/AGE: 1965-12-12 49 y.o.  Admit date: 08/05/2014 Discharge date: 08/13/2014  Discharge Diagnoses Patient Active Problem List   Diagnosis Date Noted  . Hypertension   . High cholesterol   . Fall 08/06/2014  . Small intestine injury 08/06/2014  . Closed fracture of transverse process of lumbar vertebra 08/05/2014  . Syncope 07/18/2014    Consultants None   Procedures 7/18 -- Exploratory laparotomy, small bowel resection, placement of pelvic drain, and placement of abdominal wound VAC 20 cm2 by Dr. Manus Rudd   HPI: Artrell presented about 12-13 hours s/p a fall about 12 feet off a ladder.He landed on his back on a couple of bags of concrete.There was no loss of consciousness.He didn't present to [facility] for evaluation until about 5-6 hours after the accident. He was evaluated with labs and CT scans of his cervical, thoracic, and lumbar spine only.The spine cuts showed possible free intraperitoneal and retroperitoneal air and a CT scan of the abdomen and pelvis were recommended for further evaluation. The patient was transferred to [facility] for further evaluation.CT scans confirmed the free air as well as the transverse process fracture. He was taken to the OR for exploration.   Hospital Course: The patient was noted to have a section of small bowel that was adherent to the sigmoid mesocolon and appeared to have been torn away, causing the perforation. Pathology showed an intramural abscess in that section of bowel of unknown etiology. The patient had the expected post-operative ileus that resolved in a timely matter. He had removed his nasogastric tube in recovery but agreed to have it replaced a couple of days later when be began to show symptoms from his ileus. His blood pressure was not under good control and his medications were adjusted with plans for outpatient follow-up with his primary  care provider. Once he was tolerating a regular diet and had his pain well enough controlled he was discharged home in good condition.     Medication List    STOP taking these medications        HYDROcodone-acetaminophen 7.5-325 MG per tablet  Commonly known as:  NORCO      TAKE these medications        atorvastatin 40 MG tablet  Commonly known as:  LIPITOR  Take 40 mg by mouth daily.     FLUoxetine 20 MG capsule  Commonly known as:  PROZAC  Take 20 mg by mouth every evening.     gabapentin 300 MG capsule  Commonly known as:  NEURONTIN  Take 300 mg by mouth 3 (three) times daily.     hydrochlorothiazide 12.5 MG capsule  Commonly known as:  MICROZIDE  Take 1 capsule (12.5 mg total) by mouth daily.     hydrOXYzine 25 MG tablet  Commonly known as:  ATARAX/VISTARIL  Take 25 mg by mouth at bedtime.     levothyroxine 25 MCG tablet  Commonly known as:  SYNTHROID, LEVOTHROID  Take 25 mcg by mouth daily before breakfast.     lisinopril 40 MG tablet  Commonly known as:  PRINIVIL,ZESTRIL  Take 1 tablet (40 mg total) by mouth daily before breakfast.     metFORMIN 500 MG 24 hr tablet  Commonly known as:  GLUCOPHAGE-XR  Take 500 mg by mouth every evening.     metoprolol succinate 50 MG 24 hr tablet  Commonly known as:  TOPROL-XL  Take 50 mg by mouth daily. Take  with or immediately following a meal.     oxyCODONE-acetaminophen 10-325 MG per tablet  Commonly known as:  PERCOCET  Take 1-2 tablets by mouth every 4 (four) hours as needed for pain.     sulindac 150 MG tablet  Commonly known as:  CLINORIL  Take 150 mg by mouth 2 (two) times daily.     tiotropium 18 MCG inhalation capsule  Commonly known as:  SPIRIVA  Place 18 mcg into inhaler and inhale daily.     traMADol 50 MG tablet  Commonly known as:  ULTRAM  Take 2 tablets (100 mg total) by mouth every 6 (six) hours.     VITAMIN B 12 PO  Take 1 tablet by mouth daily.            Follow-up Information     Follow up with [facility] On 08/28/2014.   Why:  2:00PM   Contact information:   [address] [place] [address] [phone]      Schedule an appointment as soon as possible for a visit with Augustine Radar, MD.   Specialty:  Nurse Practitioner   Why:  Discuss change in BP meds   Contact information:   [address] [place] [address] [phone]        Signed: Freeman Caldron, PA-C Pager: [phone] General Trauma PA Pager: [phone] 08/13/2014, 9:29 AM

## 2014-08-13 NOTE — Discharge Instructions (Signed)
No lifting more than 5 pounds for 6 weeks. ° °No driving while taking oxycodone. °

## 2014-08-13 NOTE — Care Management Note (Signed)
Case Management Note  Patient Details  Name: Trevor [name] MRN: [id number] Date of Birth: 1965-10-09  Subjective/Objective:      Pt for dc home today with wound VAC.  VAC to be delivered this AM before 11am.  Family members to assist at home.                Action/Plan: Notified [facility] of dc date of today.  Start of care 08/14/14.   Expected Discharge Date:     08/13/14.               Expected Discharge Plan:  Home w Home Health Services  In-House Referral:     Discharge planning Services  CM Consult  Post Acute Care Choice:    Choice offered to:  Patient  DME Arranged:  Vac DME Agency:  [facility]  HH Arranged:  RN HH Agency:  [facility]  Status of Service:  Completed, signed off  Medicare Important Message Given:    Date Medicare IM Given:    Medicare IM give by:    Date Additional Medicare IM Given:    Additional Medicare Important Message give by:     If discussed at Long Length of Stay Meetings, dates discussed:    Additional Comments:  Quintella Baton, RN, BSN  Trauma/Neuro ICU Case Manager [phone]

## 2014-08-16 ENCOUNTER — Encounter: Payer: Medicaid Other | Admitting: Cardiology

## 2014-08-16 NOTE — Progress Notes (Signed)
Patient ID: Trevor [name], male   DOB: 09/11/1965, 48 y.o.   MRN: [id number] ERROR

## 2014-09-12 ENCOUNTER — Telehealth (HOSPITAL_COMMUNITY): Payer: Self-pay

## 2014-09-16 NOTE — Telephone Encounter (Signed)
Has appointment on 09/18/14, he says he's in a lot of pain, but he can wait until his appointment on Wednesday.  He said a few weeks ago it was infected.  I do not see record of an infection in [employer].  Emina last saw him at the beginning of August.  She did not give him any pain meds at that time.  I've advised him that before giving him any pain meds we need to evaluate him to make sure there is not a problem.  I've told him to go to the ER if the pain can not be managed at home.  He can take over the counter pain meds if he does not have an allergy or sensitivity.

## 2014-10-29 ENCOUNTER — Telehealth (HOSPITAL_COMMUNITY): Payer: Self-pay | Admitting: Orthopedic Surgery

## 2014-10-30 ENCOUNTER — Telehealth (HOSPITAL_COMMUNITY): Payer: Self-pay | Admitting: Orthopedic Surgery

## 2014-10-30 NOTE — Telephone Encounter (Signed)
Spoke with Dr. Cabbell's office and informed them of his appt next week. They are going to fax over CT results from [facility]. 

## 2014-10-30 NOTE — Telephone Encounter (Signed)
Spoke with Dr. Sueanne Margaritaabbell's office and informed them of his appt next week. They are going to fax over CT results from [facility].

## 2015-02-04 ENCOUNTER — Other Ambulatory Visit: Payer: Self-pay | Admitting: Neurosurgery

## 2015-02-21 ENCOUNTER — Ambulatory Visit (INDEPENDENT_AMBULATORY_CARE_PROVIDER_SITE_OTHER): Payer: Medicaid Other | Admitting: Cardiology

## 2015-02-21 ENCOUNTER — Encounter: Payer: Self-pay | Admitting: Cardiology

## 2015-02-21 VITALS — BP 158/111 | HR 86 | Ht 67.0 in | Wt 237.0 lb

## 2015-02-21 DIAGNOSIS — R55 Syncope and collapse: Secondary | ICD-10-CM

## 2015-02-21 DIAGNOSIS — I10 Essential (primary) hypertension: Secondary | ICD-10-CM | POA: Diagnosis not present
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
## 2015-02-21 NOTE — Patient Instructions (Signed)
Your physician recommends that you schedule a follow-up appointment in: 4 months with Dr. Wyline Mood   Your physician recommends that you continue on your current medications as directed. Please refer to the Current Medication list given to you today.  Your physician has requested that you regularly monitor and record your blood pressure readings at home FOR 7 DAYS AND CALL OR BRING IN RESULTS . Please use the same machine at the same time of day to check your readings and record them to bring to your follow-up visit.  Thank you for choosing [facility]!!

## 2015-02-24 NOTE — Pre-Procedure Instructions (Signed)
Trevor [name]  02/24/2015      [facility] - [place], [place] - [address] Trevor [name] [address] Phone: [phone] Fax: [phone]    Your procedure is scheduled on 03-05-15  Report to [facility] Admitting at 700 A.M.  Call this number if you have problems the morning of surgery:  [phone]   Remember:  Do not eat food or drink liquids after midnight.  Take these medicines the morning of surgery with A SIP OF WATER doxazosin (Cardura), gabapentin (Neurontin), levothyroxine (Synthroid), metoprolol Succinate (Toprol-XL) Tiotropium (Spiriva), Flomax  Stop taking aspirin, Ibuprofen, BC's, Goody's, Herbal medications, Fish Oil, Aleve   Do not wear jewelry, make-up or nail polish.  Do not wear lotions, powders, or perfumes.  You may wear deodorant.  Do not shave 48 hours prior to surgery.  Men may shave face and neck.  Do not bring valuables to the hospital.  [facility] is not responsible for any belongings or valuables.  Contacts, dentures or bridgework may not be worn into surgery.  Leave your suitcase in the car.  After surgery it may be brought to your room.  For patients admitted to the hospital, discharge time will be determined by your treatment team.  Patients discharged the day of surgery will not be allowed to drive home.   Special instructions:   - Preparing for Surgery  Before surgery, you can play an important role.  Because skin is not sterile, your skin needs to be as free of germs as possible.  You can reduce the number of germs on you skin by washing with CHG (chlorahexidine gluconate) soap before surgery.  CHG is an antiseptic cleaner which kills germs and bonds with the skin to continue killing germs even after washing.  Please DO NOT use if you have an allergy to CHG or antibacterial soaps.  If your skin becomes reddened/irritated stop using the CHG and inform your nurse when you arrive at Short  Stay.  Do not shave (including legs and underarms) for at least 48 hours prior to the first CHG shower.  You may shave your face.  Please follow these instructions carefully:   1.  Shower with CHG Soap the night before surgery and the   morning of Surgery.  2.  If you choose to wash your hair, wash your hair first as usual with your   normal shampoo.  3.  After you shampoo, rinse your hair and body thoroughly to remove the  Shampoo.  4.  Use CHG as you would any other liquid soap.  You can apply chg directly  to the skin and wash gently with scrungie or a clean washcloth.  5.  Apply the CHG Soap to your body ONLY FROM THE NECK DOWN.        Do not use on open wounds or open sores.  Avoid contact with your eyes,   ears, mouth and genitals (private parts).  Wash genitals (private parts)  with your normal soap.  6.  Wash thoroughly, paying special attention to the area where your surgery  will be performed.  7.  Thoroughly rinse your body with warm water from the neck down.  8.  DO NOT shower/wash with your normal soap after using and rinsing off  the CHG Soap.  9.  Pat yourself dry with a clean towel.            10.  Wear  clean pajamas.            11.  Place clean sheets on your bed the night of your first shower and do not sleep with pets.  Day of Surgery  Do not apply any lotions/deoderants the morning of surgery.  Please wear clean clothes to the hospital/surgery center.     Please read over the following fact sheets that you were given. Pain Booklet, Coughing and Deep Breathing, Blood Transfusion Information, MRSA Information and Surgical Site Infection Prevention

## 2015-02-25 ENCOUNTER — Encounter (HOSPITAL_COMMUNITY)
Admission: RE | Admit: 2015-02-25 | Discharge: 2015-02-25 | Disposition: A | Discharge status: Home / Self Care | Payer: Medicaid Other | Source: Ambulatory Visit | Attending: Neurosurgery | Admitting: Neurosurgery

## 2015-02-25 ENCOUNTER — Encounter (HOSPITAL_COMMUNITY): Payer: Self-pay

## 2015-02-25 DIAGNOSIS — F329 Major depressive disorder, single episode, unspecified: Secondary | ICD-10-CM | POA: Insufficient documentation

## 2015-02-25 DIAGNOSIS — E039 Hypothyroidism, unspecified: Secondary | ICD-10-CM | POA: Insufficient documentation

## 2015-02-25 DIAGNOSIS — J449 Chronic obstructive pulmonary disease, unspecified: Secondary | ICD-10-CM | POA: Diagnosis not present

## 2015-02-25 DIAGNOSIS — Z7984 Long term (current) use of oral hypoglycemic drugs: Secondary | ICD-10-CM | POA: Insufficient documentation

## 2015-02-25 DIAGNOSIS — E119 Type 2 diabetes mellitus without complications: Secondary | ICD-10-CM | POA: Insufficient documentation

## 2015-02-25 DIAGNOSIS — E7800 Pure hypercholesterolemia, unspecified: Secondary | ICD-10-CM | POA: Diagnosis not present

## 2015-02-25 DIAGNOSIS — Z0183 Encounter for blood typing: Secondary | ICD-10-CM | POA: Diagnosis not present

## 2015-02-25 DIAGNOSIS — M4316 Spondylolisthesis, lumbar region: Secondary | ICD-10-CM | POA: Diagnosis not present

## 2015-02-25 DIAGNOSIS — Z01818 Encounter for other preprocedural examination: Secondary | ICD-10-CM | POA: Insufficient documentation

## 2015-02-25 DIAGNOSIS — Z79899 Other long term (current) drug therapy: Secondary | ICD-10-CM | POA: Insufficient documentation

## 2015-02-25 DIAGNOSIS — Z01812 Encounter for preprocedural laboratory examination: Secondary | ICD-10-CM | POA: Diagnosis not present

## 2015-02-25 DIAGNOSIS — I10 Essential (primary) hypertension: Secondary | ICD-10-CM | POA: Insufficient documentation

## 2015-02-25 DIAGNOSIS — F419 Anxiety disorder, unspecified: Secondary | ICD-10-CM | POA: Insufficient documentation

## 2015-02-25 HISTORY — DX: Hypothyroidism, unspecified: E03.9

## 2015-02-25 HISTORY — DX: Chronic obstructive pulmonary disease, unspecified: J44.9

## 2015-02-25 HISTORY — DX: Reserved for inherently not codable concepts without codable children: IMO0001

## 2015-02-25 HISTORY — DX: Depression, unspecified: F32.A

## 2015-02-25 HISTORY — DX: Unspecified osteoarthritis, unspecified site: M19.90

## 2015-02-25 HISTORY — DX: Major depressive disorder, single episode, unspecified: F32.9

## 2015-02-25 HISTORY — DX: Anxiety disorder, unspecified: F41.9

## 2015-02-25 LAB — COMPREHENSIVE METABOLIC PANEL
ALT: 42 U/L (ref 17–63)
AST: 26 U/L (ref 15–41)
Albumin: 3.9 g/dL (ref 3.5–5.0)
Alkaline Phosphatase: 111 U/L (ref 38–126)
Anion gap: 10 (ref 5–15)
BILIRUBIN TOTAL: 1 mg/dL (ref 0.3–1.2)
BUN: 9 mg/dL (ref 6–20)
CO2: 24 mmol/L (ref 22–32)
CREATININE: 0.92 mg/dL (ref 0.61–1.24)
Calcium: 8.6 mg/dL — ABNORMAL LOW (ref 8.9–10.3)
Chloride: 106 mmol/L (ref 101–111)
GFR calc Af Amer: >60 mL/min (ref >60)
Glucose, Bld: 147 mg/dL — ABNORMAL HIGH (ref 65–99)
POTASSIUM: 3.7 mmol/L (ref 3.5–5.1)
Sodium: 140 mmol/L (ref 135–145)
Total Protein: 7.3 g/dL (ref 6.5–8.1)

## 2015-02-25 LAB — TYPE AND SCREEN
ABO/RH(D): B NEG
ANTIBODY SCREEN: NEGATIVE

## 2015-02-25 LAB — CBC
HEMATOCRIT: 43.2 % (ref 39.0–52.0)
Hemoglobin: 14.8 g/dL (ref 13.0–17.0)
MCH: 29.2 pg (ref 26.0–34.0)
MCHC: 34.3 g/dL (ref 30.0–36.0)
MCV: 85.2 fL (ref 78.0–100.0)
PLATELETS: 190 10*3/uL (ref 150–400)
RBC: 5.07 MIL/uL (ref 4.22–5.81)
RDW: 15 % (ref 11.5–15.5)
WBC: 6.8 10*3/uL (ref 4.0–10.5)

## 2015-02-25 LAB — SURGICAL PCR SCREEN
MRSA, PCR: NEGATIVE
Staphylococcus aureus: NEGATIVE

## 2015-02-25 LAB — ABO/RH: ABO/RH(D): B NEG

## 2015-02-25 LAB — GLUCOSE, CAPILLARY: Glucose-Capillary: 144 mg/dL — ABNORMAL HIGH (ref 65–99)

## 2015-02-25 NOTE — Progress Notes (Signed)
pcp is [employer] copywriter- states he plans to see her tomorrow Cardiologist is Dr Wyline Mood last saw 02-21-15 Reports he normally doesn't check his blood sugars. States he had an echo at [facility] -request sent for results

## 2015-02-25 NOTE — Progress Notes (Signed)
   02/25/15 0959  OBSTRUCTIVE SLEEP APNEA  Have you ever been diagnosed with sleep apnea through a sleep study? No  Do you snore loudly (loud enough to be heard through closed doors)?  1  Do you often feel tired, fatigued, or sleepy during the daytime (such as falling asleep during driving or talking to someone)? 0  Has anyone observed you stop breathing during your sleep? 0  Do you have, or are you being treated for high blood pressure? 1  BMI more than 35 kg/m2? 1  Age > 50 (1-yes) 0  Neck circumference greater than:Male 16 inches or larger, Male 17inches or larger? 1  Male Gender (Yes=1) 1  Obstructive Sleep Apnea Score 5  Score 5 or greater  Results sent to PCP

## 2015-02-26 LAB — HEMOGLOBIN A1C
HEMOGLOBIN A1C: 6.4 % — AB (ref 4.8–5.6)
Mean Plasma Glucose: 137 mg/dL

## 2015-02-26 NOTE — Progress Notes (Signed)
Anesthesia Chart Review: Patient is a 50 year old male scheduled for L4-5, L5-S1 PLIF on 03/05/15 by Dr. Franky Macho.  History includes smoking, syncope, "old MI", diabetes mellitus type 2, hepatitis C, hypertension, hypercholesterolemia, COPD, hypothyroidism, anxiety, depression, shortness of breath, arthritis, hernia repair, exploratory laparotomy with small bowel resection for perforated small bowel (following fall from 12 feet) 08/05/14. He admitted to prior marijuana use. 2012 records from [facility] also indicate a positive UDS for cocaine (despite patient denial of use for 4 years). His "old MI" history is unclear. In 05/2010 he did have an admission for chest pain in the setting of + UDS for cocaine and acute renal failure (Cr 2.8). Troponin peaked at 0.09, and felt non-diagnostic in the setting of acute renal failure. He was seen by cardiologist Dr. Myrtis Ser and had a stress/echo. Now he is followed by Dr. Wyline Mood, more recently for syncope, possibly related to dehydration. Dr. Wyline Mood thought opiate induced vasodilation may have contributed as well. No AS noted on 09/2014 echo.   Alfredo Batty, NP signed a note of medical clearance. Cardiologist Dr. Dina Rich signed a note of cardiac clearance following recent 02/21/15 office visit.  Meds include Lipitor, clonidine, doxazosin, Prozac, Synthroid, lisinopril, metformin, Toprol-XL, Flomax, Spiriva.  10/02/14 EKG [facility]): SR.   10/03/14 Echo [facility]): LV chamber size is normal. Moderate concentric LVH. Global LV wall motion and contractility are WNL. Estimated EF 60-65%. Abnormal LV diastolic function is observed. The LA chamber size is normal. AV structure is normal. MV leaflets are normal. RVSP 30 mmHg.   10/03/14 Carotid U/S [facility]): Mild plaque formation is noted in both proximal ICAs consistent with < 50% diameter stenosis based on ultrasound and Doppler criteria. Antegrade vertebral artery  flow noted.   06/16/10 Nuclear stress test [facility]): Conclusion: Probably normal LV perfusion. Probably normal exercise myocardial perfusion with Tc-18m sestamibi imaging. Stress testing induced no chest pain symptoms and no ECG changes consistent with ischemia. Global LV systolic function was normal, with an EF of 57%. In addition, there was normal wall motion. There was a small, fixed, mid to basal inferior defect  08/05/14 CT chest/abd/pelvis: IMPRESSION: 1. Moderate free peritoneal air likely due to posttraumatic injury of a short segment of small bowel over the lower midline abdomen with small adjacent mesenteric hematoma. 2. Known displaced left L1 and L2 transverse process fractures. 3. No acute findings within the chest. 4. 3 mm nodular density over the posterior aspect of the left upper lobe. Recommend followup chest CT 1 year. This recommendation follows the consensus statement: Guidelines for Management of Small Pulmonary Nodules Detected on CT Scans: A Statement from the Fleischner Society as published in Radiology 2005; 237:395-400. Online at: DietDisorder.cz. 5. Minimal diverticulosis of the colon. 6. Mild atherosclerotic coronary artery disease.   Preoperative labs noted. A1c 6.4.   Patient was cleared from a medical and cardiac standpoint. If no acute changes then I anticipate that he can proceed as planned.  Velna Ochs [facility] Short Stay Center/Anesthesiology Phone [phone] 02/26/2015 5:33 PM

## 2015-03-03 ENCOUNTER — Telehealth: Payer: Self-pay | Admitting: *Deleted

## 2015-03-03 NOTE — Telephone Encounter (Signed)
F/u 1 week BP log from 02/22/15 - 03/01/15 will forward to Dr. Wyline Mood  173/111  HR 80 109/77    HR 78 135/92    HR 68 135/96    HR 68 182/82    HR 71 151/96    HR 74 119/83    HR 68

## 2015-03-04 MED ORDER — CEFAZOLIN SODIUM-DEXTROSE 2-3 GM-% IV SOLR
2.0000 g | INTRAVENOUS | Status: AC
  Administered 2015-03-05 (×2): 2 g via INTRAVENOUS
  Filled 2015-03-04: qty 50

## 2015-03-04 NOTE — Telephone Encounter (Signed)
LM for pt to returncall

## 2015-03-04 NOTE — Telephone Encounter (Signed)
BP is up and down, most of numbers are at goal. No med changes, we will reevaluate at our follow up. Ask him to keep check bp 2-3 times a week until then  J Joaovictor Krone MD

## 2015-03-05 ENCOUNTER — Inpatient Hospital Stay (HOSPITAL_COMMUNITY): Payer: Medicaid Other

## 2015-03-05 ENCOUNTER — Encounter (HOSPITAL_COMMUNITY)
Admission: RE | Disposition: A | Discharge status: Home / Self Care | Payer: Self-pay | Source: Ambulatory Visit | Attending: Neurosurgery

## 2015-03-05 ENCOUNTER — Inpatient Hospital Stay (HOSPITAL_COMMUNITY): Payer: Medicaid Other | Admitting: Critical Care Medicine

## 2015-03-05 ENCOUNTER — Inpatient Hospital Stay (HOSPITAL_COMMUNITY): Payer: Medicaid Other | Admitting: Vascular Surgery

## 2015-03-05 ENCOUNTER — Inpatient Hospital Stay (HOSPITAL_COMMUNITY)
Admission: RE | Admit: 2015-03-05 | Discharge: 2015-03-07 | DRG: 460 | Disposition: A | Discharge status: Home / Self Care | Payer: Medicaid Other | Source: Ambulatory Visit | Attending: Neurosurgery | Admitting: Neurosurgery

## 2015-03-05 ENCOUNTER — Encounter (HOSPITAL_COMMUNITY): Payer: Self-pay | Admitting: *Deleted

## 2015-03-05 DIAGNOSIS — F1290 Cannabis use, unspecified, uncomplicated: Secondary | ICD-10-CM | POA: Diagnosis present

## 2015-03-05 DIAGNOSIS — M4316 Spondylolisthesis, lumbar region: Secondary | ICD-10-CM | POA: Diagnosis present

## 2015-03-05 DIAGNOSIS — M549 Dorsalgia, unspecified: Secondary | ICD-10-CM | POA: Diagnosis present

## 2015-03-05 DIAGNOSIS — E119 Type 2 diabetes mellitus without complications: Secondary | ICD-10-CM | POA: Diagnosis present

## 2015-03-05 DIAGNOSIS — M4726 Other spondylosis with radiculopathy, lumbar region: Principal | ICD-10-CM | POA: Diagnosis present

## 2015-03-05 DIAGNOSIS — Z419 Encounter for procedure for purposes other than remedying health state, unspecified: Secondary | ICD-10-CM

## 2015-03-05 LAB — GLUCOSE, CAPILLARY
GLUCOSE-CAPILLARY: 101 mg/dL — AB (ref 65–99)
GLUCOSE-CAPILLARY: 127 mg/dL — AB (ref 65–99)
GLUCOSE-CAPILLARY: 120 mg/dL — AB (ref 65–99)
Glucose-Capillary: 111 mg/dL — ABNORMAL HIGH (ref 65–99)

## 2015-03-05 SURGERY — POSTERIOR LUMBAR FUSION 2 LEVEL
Anesthesia: General | Site: Spine Lumbar

## 2015-03-05 MED ORDER — ROCURONIUM BROMIDE 100 MG/10ML IV SOLN
INTRAVENOUS | Status: DC | PRN
  Administered 2015-03-05: 10 mg via INTRAVENOUS
  Administered 2015-03-05: 20 mg via INTRAVENOUS
  Administered 2015-03-05 (×2): 10 mg via INTRAVENOUS
  Administered 2015-03-05: 20 mg via INTRAVENOUS
  Administered 2015-03-05: 40 mg via INTRAVENOUS
  Administered 2015-03-05 (×2): 20 mg via INTRAVENOUS
  Administered 2015-03-05: 10 mg via INTRAVENOUS

## 2015-03-05 MED ORDER — LACTATED RINGERS IV SOLN
INTRAVENOUS | Status: DC
  Administered 2015-03-05: 08:00:00 via INTRAVENOUS

## 2015-03-05 MED ORDER — HYDROMORPHONE HCL 1 MG/ML IJ SOLN
0.2500 mg | INTRAMUSCULAR | Status: DC | PRN
  Administered 2015-03-05 (×4): 0.5 mg via INTRAVENOUS

## 2015-03-05 MED ORDER — ARTIFICIAL TEARS OP OINT
TOPICAL_OINTMENT | OPHTHALMIC | Status: AC
  Filled 2015-03-05: qty 3.5

## 2015-03-05 MED ORDER — EPHEDRINE SULFATE 50 MG/ML IJ SOLN
INTRAMUSCULAR | Status: AC
  Filled 2015-03-05: qty 1

## 2015-03-05 MED ORDER — PROMETHAZINE HCL 25 MG/ML IJ SOLN: 6.2500 mg | INTRAMUSCULAR | Status: DC | PRN

## 2015-03-05 MED ORDER — SURGIFOAM 100 EX MISC
CUTANEOUS | Status: DC | PRN
  Administered 2015-03-05: 10:00:00 via TOPICAL

## 2015-03-05 MED ORDER — MIDAZOLAM HCL 2 MG/2ML IJ SOLN
INTRAMUSCULAR | Status: AC
  Filled 2015-03-05: qty 2

## 2015-03-05 MED ORDER — 0.9 % SODIUM CHLORIDE (POUR BTL) OPTIME
TOPICAL | Status: DC | PRN
  Administered 2015-03-05: 1000 mL

## 2015-03-05 MED ORDER — CYCLOBENZAPRINE HCL 10 MG PO TABS
10.0000 mg | ORAL_TABLET | Freq: Three times a day (TID) | ORAL | Status: DC | PRN
  Administered 2015-03-05 – 2015-03-07 (×4): 10 mg via ORAL
  Filled 2015-03-05 (×3): qty 1

## 2015-03-05 MED ORDER — SUCCINYLCHOLINE CHLORIDE 20 MG/ML IJ SOLN
INTRAMUSCULAR | Status: DC | PRN
  Administered 2015-03-05: 100 mg via INTRAVENOUS

## 2015-03-05 MED ORDER — FENTANYL CITRATE (PF) 250 MCG/5ML IJ SOLN
INTRAMUSCULAR | Status: AC
  Filled 2015-03-05: qty 5

## 2015-03-05 MED ORDER — HYDROMORPHONE HCL 1 MG/ML IJ SOLN
INTRAMUSCULAR | Status: AC
  Administered 2015-03-05: 0.5 mg via INTRAVENOUS
  Filled 2015-03-05: qty 2

## 2015-03-05 MED ORDER — BUPIVACAINE HCL (PF) 0.5 % IJ SOLN
INTRAMUSCULAR | Status: DC | PRN
  Administered 2015-03-05: 30 mL

## 2015-03-05 MED ORDER — LIDOCAINE-EPINEPHRINE 0.5 %-1:200000 IJ SOLN
INTRAMUSCULAR | Status: DC | PRN
  Administered 2015-03-05: 10 mL

## 2015-03-05 MED ORDER — PHENYLEPHRINE 40 MCG/ML (10ML) SYRINGE FOR IV PUSH (FOR BLOOD PRESSURE SUPPORT)
PREFILLED_SYRINGE | INTRAVENOUS | Status: AC
  Filled 2015-03-05: qty 10

## 2015-03-05 MED ORDER — OXYCODONE-ACETAMINOPHEN 5-325 MG PO TABS
1.0000 | ORAL_TABLET | ORAL | Status: DC | PRN
  Administered 2015-03-05 – 2015-03-07 (×10): 2 via ORAL
  Filled 2015-03-05 (×10): qty 2

## 2015-03-05 MED ORDER — GLYCOPYRROLATE 0.2 MG/ML IJ SOLN
INTRAMUSCULAR | Status: AC
  Filled 2015-03-05: qty 1

## 2015-03-05 MED ORDER — OXYCODONE-ACETAMINOPHEN 5-325 MG PO TABS
ORAL_TABLET | ORAL | Status: AC
  Filled 2015-03-05: qty 2

## 2015-03-05 MED ORDER — LIDOCAINE HCL (CARDIAC) 20 MG/ML IV SOLN
INTRAVENOUS | Status: AC
  Filled 2015-03-05: qty 5

## 2015-03-05 MED ORDER — PROPOFOL 10 MG/ML IV BOLUS
INTRAVENOUS | Status: DC | PRN
  Administered 2015-03-05: 200 mg via INTRAVENOUS

## 2015-03-05 MED ORDER — SUCCINYLCHOLINE CHLORIDE 20 MG/ML IJ SOLN
INTRAMUSCULAR | Status: AC
  Filled 2015-03-05: qty 1

## 2015-03-05 MED ORDER — LIDOCAINE HCL (CARDIAC) 20 MG/ML IV SOLN
INTRAVENOUS | Status: DC | PRN
  Administered 2015-03-05: 50 mg via INTRAVENOUS

## 2015-03-05 MED ORDER — SODIUM CHLORIDE 0.9 % IV SOLN
INTRAVENOUS | Status: DC | PRN
  Administered 2015-03-05: 16:00:00 via INTRAVENOUS

## 2015-03-05 MED ORDER — HYDROMORPHONE HCL 1 MG/ML IJ SOLN: 0.2500 mg | INTRAMUSCULAR | Status: DC | PRN

## 2015-03-05 MED ORDER — CYCLOBENZAPRINE HCL 10 MG PO TABS
ORAL_TABLET | ORAL | Status: AC
  Filled 2015-03-05: qty 1

## 2015-03-05 MED ORDER — SUGAMMADEX SODIUM 500 MG/5ML IV SOLN
INTRAVENOUS | Status: DC | PRN
Start: 2015-03-05 — End: 2015-03-05
  Administered 2015-03-05: 216 mg via INTRAVENOUS

## 2015-03-05 MED ORDER — ROCURONIUM BROMIDE 50 MG/5ML IV SOLN
INTRAVENOUS | Status: AC
  Filled 2015-03-05: qty 3

## 2015-03-05 MED ORDER — MIDAZOLAM HCL 5 MG/5ML IJ SOLN
INTRAMUSCULAR | Status: DC | PRN
  Administered 2015-03-05: 2 mg via INTRAVENOUS

## 2015-03-05 MED ORDER — SUGAMMADEX SODIUM 500 MG/5ML IV SOLN
INTRAVENOUS | Status: AC
  Filled 2015-03-05: qty 5

## 2015-03-05 MED ORDER — ALBUMIN HUMAN 5 % IV SOLN
INTRAVENOUS | Status: DC | PRN
  Administered 2015-03-05: 12:00:00 via INTRAVENOUS

## 2015-03-05 MED ORDER — LACTATED RINGERS IV SOLN
INTRAVENOUS | Status: DC | PRN
  Administered 2015-03-05 (×2): via INTRAVENOUS

## 2015-03-05 MED ORDER — FENTANYL CITRATE (PF) 100 MCG/2ML IJ SOLN
INTRAMUSCULAR | Status: DC | PRN
  Administered 2015-03-05 (×2): 100 ug via INTRAVENOUS
  Administered 2015-03-05: 150 ug via INTRAVENOUS
  Administered 2015-03-05: 100 ug via INTRAVENOUS
  Administered 2015-03-05: 50 ug via INTRAVENOUS

## 2015-03-05 MED ORDER — PROPOFOL 10 MG/ML IV BOLUS
INTRAVENOUS | Status: AC
  Filled 2015-03-05: qty 20

## 2015-03-05 SURGICAL SUPPLY — 88 items
APL SKNCLS STERI-STRIP NONHPOA (GAUZE/BANDAGES/DRESSINGS)
APL SRG 60D 8 XTD TIP BNDBL (TIP) ×1
BAG DECANTER FOR FLEXI CONT (MISCELLANEOUS) ×1 IMPLANT
BENZOIN TINCTURE PRP APPL 2/3 (GAUZE/BANDAGES/DRESSINGS) IMPLANT
BIT DRILL PLIF MAS DISP 5.5MM (DRILL) IMPLANT
BLADE CLIPPER SURG (BLADE) IMPLANT
BUR MATCHSTICK NEURO 3.0 LAGG (BURR) ×3 IMPLANT
BUR PRECISION FLUTE 5.0 (BURR) ×3 IMPLANT
CANISTER SUCT 3000ML PPV (MISCELLANEOUS) ×3 IMPLANT
CLOSURE WOUND 1/2 X4 (GAUZE/BANDAGES/DRESSINGS)
CONT SPEC 4OZ CLIKSEAL STRL BL (MISCELLANEOUS) ×3 IMPLANT
COVER BACK TABLE 60X90IN (DRAPES) ×2 IMPLANT
DECANTER SPIKE VIAL GLASS SM (MISCELLANEOUS) ×3 IMPLANT
DRAPE C-ARM 42X72 X-RAY (DRAPES) ×4 IMPLANT
DRAPE C-ARMOR (DRAPES) ×2 IMPLANT
DRAPE LAPAROTOMY 100X72X124 (DRAPES) ×3 IMPLANT
DRAPE POUCH INSTRU U-SHP 10X18 (DRAPES) ×3 IMPLANT
DRAPE SURG 17X23 STRL (DRAPES) ×3 IMPLANT
DRILL PLIF MAS DISP 5.5MM (DRILL) ×3
DRSG OPSITE POSTOP 4X8 (GAUZE/BANDAGES/DRESSINGS) ×2 IMPLANT
DURAPREP 26ML APPLICATOR (WOUND CARE) ×3 IMPLANT
DURASEAL APPLICATOR TIP (TIP) ×2 IMPLANT
DURASEAL SPINE SEALANT 3ML (MISCELLANEOUS) ×2 IMPLANT
ELECT BLADE 4.0 EZ CLEAN MEGAD (MISCELLANEOUS) ×3
ELECT REM PT RETURN 9FT ADLT (ELECTROSURGICAL) ×3
ELECTRODE BLDE 4.0 EZ CLN MEGD (MISCELLANEOUS) IMPLANT
ELECTRODE REM PT RTRN 9FT ADLT (ELECTROSURGICAL) ×1 IMPLANT
GAUZE SPONGE 4X4 12PLY STRL (GAUZE/BANDAGES/DRESSINGS) IMPLANT
GAUZE SPONGE 4X4 16PLY XRAY LF (GAUZE/BANDAGES/DRESSINGS) ×2 IMPLANT
GLOVE BIO SURGEON STRL SZ 6.5 (GLOVE) ×1 IMPLANT
GLOVE BIO SURGEON STRL SZ7 (GLOVE) ×2 IMPLANT
GLOVE BIO SURGEON STRL SZ8 (GLOVE) ×2 IMPLANT
GLOVE BIO SURGEONS STRL SZ 6.5 (GLOVE) ×1
GLOVE BIOGEL PI IND STRL 6.5 (GLOVE) IMPLANT
GLOVE BIOGEL PI IND STRL 8.5 (GLOVE) IMPLANT
GLOVE BIOGEL PI INDICATOR 6.5 (GLOVE) ×2
GLOVE BIOGEL PI INDICATOR 8.5 (GLOVE) ×2
GLOVE ECLIPSE 6.5 STRL STRAW (GLOVE) ×6 IMPLANT
GLOVE EXAM NITRILE LRG STRL (GLOVE) IMPLANT
GLOVE EXAM NITRILE MD LF STRL (GLOVE) IMPLANT
GLOVE EXAM NITRILE XL STR (GLOVE) IMPLANT
GLOVE EXAM NITRILE XS STR PU (GLOVE) IMPLANT
GLOVE INDICATOR 7.0 STRL GRN (GLOVE) ×4 IMPLANT
GLOVE INDICATOR 7.5 STRL GRN (GLOVE) ×2 IMPLANT
GLOVE SURG SS PI 6.5 STRL IVOR (GLOVE) ×8 IMPLANT
GOWN STRL REUS W/ TWL LRG LVL3 (GOWN DISPOSABLE) ×2 IMPLANT
GOWN STRL REUS W/ TWL XL LVL3 (GOWN DISPOSABLE) IMPLANT
GOWN STRL REUS W/TWL 2XL LVL3 (GOWN DISPOSABLE) IMPLANT
GOWN STRL REUS W/TWL LRG LVL3 (GOWN DISPOSABLE) ×21
GOWN STRL REUS W/TWL XL LVL3 (GOWN DISPOSABLE) ×3
GRAFT DURAGEN MATRIX 1WX1L (Tissue) ×2 IMPLANT
KIT BASIN OR (CUSTOM PROCEDURE TRAY) ×3 IMPLANT
KIT POSITION SURG JACKSON T1 (MISCELLANEOUS) ×3 IMPLANT
KIT ROOM TURNOVER OR (KITS) ×3 IMPLANT
LIQUID BAND (GAUZE/BANDAGES/DRESSINGS) ×3 IMPLANT
MILL MEDIUM DISP (BLADE) ×2 IMPLANT
NDL HYPO 25X1 1.5 SAFETY (NEEDLE) ×1 IMPLANT
NDL SPNL 18GX3.5 QUINCKE PK (NEEDLE) IMPLANT
NEEDLE HYPO 25X1 1.5 SAFETY (NEEDLE) ×3 IMPLANT
NEEDLE SPNL 18GX3.5 QUINCKE PK (NEEDLE) ×3 IMPLANT
NS IRRIG 1000ML POUR BTL (IV SOLUTION) ×3 IMPLANT
PACK LAMINECTOMY NEURO (CUSTOM PROCEDURE TRAY) ×3 IMPLANT
PAD ARMBOARD 7.5X6 YLW CONV (MISCELLANEOUS) ×13 IMPLANT
ROD 50MM LUMBAR (Rod) ×2 IMPLANT
ROD 55MM (Rod) ×3 IMPLANT
ROD SPNL 55XPREBNT NS MAS (Rod) IMPLANT
SCREW LOCK (Screw) ×18 IMPLANT
SCREW LOCK FXNS SPNE MAS PL (Screw) IMPLANT
SCREW PLIF MAS 5.5X35 LUMBAR (Screw) ×4 IMPLANT
SCREW SHANK 6.5X40 (Screw) ×3 IMPLANT
SCREW SHANK 6.5X40 NS LF (Screw) IMPLANT
SCREW SHANK 6.5X65 (Screw) ×2 IMPLANT
SCREW SHANK PLIF MAS 5.5X40 (Screw) ×2 IMPLANT
SCREW SHANKS 5.5X35 (Screw) ×2 IMPLANT
SCREW TULIP 5.5 (Screw) ×8 IMPLANT
SPACER OPAL 10MMX28MMX11MM (Spacer) ×4 IMPLANT
SPONGE LAP 4X18 X RAY DECT (DISPOSABLE) ×2 IMPLANT
SPONGE SURGIFOAM ABS GEL 100 (HEMOSTASIS) ×3 IMPLANT
STRIP CLOSURE SKIN 1/2X4 (GAUZE/BANDAGES/DRESSINGS) IMPLANT
SUT PROLENE 6 0 BV (SUTURE) ×2 IMPLANT
SUT VIC AB 0 CT1 18XCR BRD8 (SUTURE) ×1 IMPLANT
SUT VIC AB 0 CT1 8-18 (SUTURE) ×3
SUT VIC AB 2-0 CT1 18 (SUTURE) ×3 IMPLANT
SUT VIC AB 3-0 SH 8-18 (SUTURE) ×3 IMPLANT
SYR 3ML LL SCALE MARK (SYRINGE) ×8 IMPLANT
TOWEL OR 17X24 6PK STRL BLUE (TOWEL DISPOSABLE) ×3 IMPLANT
TOWEL OR 17X26 10 PK STRL BLUE (TOWEL DISPOSABLE) ×3 IMPLANT
WATER STERILE IRR 1000ML POUR (IV SOLUTION) ×3 IMPLANT

## 2015-03-05 NOTE — Anesthesia Postprocedure Evaluation (Signed)
Anesthesia Post Note  Patient: Trevor [name]  Procedure(s) Performed: Procedure(s) (LRB): LUMBAR FOUR-FIVE,LUMBAR FIVE SACRAL-ONE POSTERIOR LUMBAR INTERBODY  FUSION WITH INTERBODY PROTHESIS POSTERIOR LATERAL ARTHRODESIS AND POSTERIOR SEGMENTAL INSTRUMENTATION  (N/A)  Patient location during evaluation: PACU Anesthesia Type: General Level of consciousness: sedated Pain management: pain level controlled Vital Signs Assessment: post-procedure vital signs reviewed and stable Respiratory status: spontaneous breathing and respiratory function stable Cardiovascular status: stable Anesthetic complications: no    Last Vitals:  Filed Vitals:   03/05/15 2015 03/05/15 2045  BP:  139/79  Pulse: 74 66  Temp:  36.6 C  Resp: 14     Last Pain:  Filed Vitals:   03/05/15 2049  PainSc: Asleep                 Ceana Fiala DANIEL

## 2015-03-05 NOTE — Op Note (Signed)
03/05/2015  6:19 PM  PATIENT:  Trevor [name]  50 y.o. male  PRE-OPERATIVE DIAGNOSIS:  lumbar spondylolisthesis L5/S1, Lumbar spondylolysis L5, Osteoarthritis with radiculopathy L4  POST-OPERATIVE DIAGNOSIS:    PROCEDURE:  Procedure(s):L5 Gill procedure L4, L5 nerve root decompression via an L4 laminecotmy, bilateral inferior L4 facetectomies PLIF L5/S1, 11x26mm Synthes cages(opal) with autograft morsels Posterolateral arthrodesis L4-S1 with autograft morsels  SURGEON:  Surgeon(s): Coletta Memos, MD Donalee Citrin, MD  ASSISTANTS:Cram, Jillyn Hidden  ANESTHESIA:   general  EBL:  Total I/O In: 3060 [I.V.:2650; Blood:160; IV Piggyback:250] Out: 1190 [Urine:540; Blood:650]  BLOOD ADMINISTERED:160 CC CELLSAVER  CELL SAVER GIVEN:yes   COUNT:per nursing  DRAINS: none   SPECIMEN:  No Specimen  DICTATION: Trevor [name] is a 50 y.o. male whom was taken to the operating room intubated, and placed under a general anesthetic without difficulty. A foley catheter was placed under sterile conditions. He was positioned prone on a Jackson stable with all pressure points properly padded.  His lumbar region was prepped and draped in a sterile manner. I infiltrated 20cc's 1/2%lidocaine/1:2000,000 strength epinephrine into the planned incision. I opened the skin with a 10 blade and took the incision down to the thoracolumbar fascia. I exposed the lamina of L4,5, and S1 in a subperiosteal fashion bilaterally. I confirmed my location with an intraoperative xray.  I placed self retaining retractors and started the decompression.  We decompressed the spinal canal via a complete laminectomy of L4, and an L5 gill procedure. This allowed for full foraminal decompression of the L4 and L5 nerve roots. We used the drill and Kerrison punches to unroof the L4 and L5 nerve roots from their takeoff from the thecal sac to outside the spinal canal. There was a great deal of redundant ligamentum flavum, and hypertrophied facet  that needed to be removed in order to effect an adequate decompression.  PLIF's were performed at L5/S1 in the same fashion. I opened the disc space with a 15 blade then used a variety of instruments to remove the disc and prepare the space for the arthrodesis. We used curettes, rongeurs, punches, shavers for the disc space, and rasps in the discetomy. I measured the disc space and placed 2 10x 28mm  Peek cages(Opal, synthes) into the disc space(s), along with autograft morsels placed both in the cages and the disc space.  I decorticated the lateral bone at L4,5, and S1. I then placed autograft morsels on the decorticated surfaces to complete the posterolateral arthrodesis.  I placed pedicle screws at L4,5, and S1, using fluoroscopic guidance. I drilled a pilot hole, then cannulated the pedicle with a drill at each site. I then tapped each pedicle, assessing each site for pedicle violations. No cutouts were appreciated. Screws (Nuvasive mas plif) were then placed at each site without difficulty. Final films were performed and all screws appeared to be in good position. I placed rods through the screw heads and secured them with locking caps. Xray showed the all of the hardware was in the correct location. I closed the wound in a layered fashion. I approximated the thoracolumbar fascia, subcutaneous, and subcuticular planes with vicryl sutures. I used dermabond, and an occlusive bandage for a sterile dressing.     PLAN OF CARE: Admit to inpatient   PATIENT DISPOSITION:  PACU - hemodynamically stable.   Delay start of Pharmacological VTE agent (>24hrs) due to surgical blood loss or risk of bleeding:  yes
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
## 2015-03-05 NOTE — Progress Notes (Signed)
Attempted to call report waiting on OK from unit.

## 2015-03-05 NOTE — Progress Notes (Addendum)
Pt arrived to unit alert and oriented x 4. Pt stated pain was 10 on 0-10 pain scale. Pt was informed of medications given and upcoming dose time. Pt oriented to unit. All medication and orders reviewed with Pt, Pt v/u. Telephone and call light placed within Pt's reach. RN will continue to monitor.

## 2015-03-05 NOTE — Anesthesia Procedure Notes (Signed)
Procedure Name: Intubation Date/Time: 03/05/2015 10:42 AM Performed by: ADAMI, RICHARD Pre-anesthesia Checklist: Emergency Drugs available, Patient identified, Timeout performed, Suction available and Patient being monitored Patient Re-evaluated:Patient Re-evaluated prior to inductionOxygen Delivery Method: Circle system utilized Preoxygenation: Pre-oxygenation with 100% oxygen Intubation Type: IV induction Grade View: Grade I Tube type: Oral Tube size: 8.0 mm Number of attempts: 1 Airway Equipment and Method: Stylet and LTA kit utilized Placement Confirmation: ETT inserted through vocal cords under direct vision,  breath sounds checked- equal and bilateral and positive ETCO2 Secured at: 22 cm Tube secured with: Tape Dental Injury: Teeth and Oropharynx as per pre-operative assessment      

## 2015-03-05 NOTE — Transfer of Care (Signed)
Immediate Anesthesia Transfer of Care Note  Patient: Trevor [name]  Procedure(s) Performed: Procedure(s): LUMBAR FOUR-FIVE,LUMBAR FIVE SACRAL-ONE POSTERIOR LUMBAR INTERBODY  FUSION WITH INTERBODY PROTHESIS POSTERIOR LATERAL ARTHRODESIS AND POSTERIOR SEGMENTAL INSTRUMENTATION  (N/A)  Patient Location: PACU  Anesthesia Type:General  Level of Consciousness: awake, alert  and oriented  Airway & Oxygen Therapy: Patient Spontanous Breathing and Patient connected to nasal cannula oxygen  Post-op Assessment: Report given to RN and Post -op Vital signs reviewed and stable  Post vital signs: Reviewed and stable  Last Vitals:  Filed Vitals:   03/05/15 0725 03/05/15 1807  BP: 149/86 148/91  Pulse: 39 78  Temp: 36.3 C 36.6 C  Resp: 20 16    Complications: No apparent anesthesia complications

## 2015-03-06 ENCOUNTER — Encounter: Payer: Self-pay | Admitting: *Deleted

## 2015-03-06 LAB — GLUCOSE, CAPILLARY
GLUCOSE-CAPILLARY: 106 mg/dL — AB (ref 65–99)
GLUCOSE-CAPILLARY: 137 mg/dL — AB (ref 65–99)
GLUCOSE-CAPILLARY: 166 mg/dL — AB (ref 65–99)
Glucose-Capillary: 136 mg/dL — ABNORMAL HIGH (ref 65–99)

## 2015-03-06 MED ORDER — SODIUM CHLORIDE 0.9 % IV SOLN
250.0000 mL | INTRAVENOUS | Status: DC
  Administered 2015-03-06: 250 mL via INTRAVENOUS

## 2015-03-06 MED ORDER — BISACODYL 5 MG PO TBEC: 5.0000 mg | DELAYED_RELEASE_TABLET | Freq: Every day | ORAL | Status: DC | PRN

## 2015-03-06 MED ORDER — ACETAMINOPHEN 650 MG RE SUPP: 650.0000 mg | RECTAL | Status: DC | PRN

## 2015-03-06 MED ORDER — SODIUM CHLORIDE 0.9% FLUSH
3.0000 mL | Freq: Two times a day (BID) | INTRAVENOUS | Status: DC
  Administered 2015-03-06 (×2): 3 mL via INTRAVENOUS

## 2015-03-06 MED ORDER — CLONIDINE HCL 0.1 MG PO TABS
0.3000 mg | ORAL_TABLET | Freq: Every day | ORAL | Status: DC
  Administered 2015-03-06: 0.3 mg via ORAL
  Filled 2015-03-06: qty 3

## 2015-03-06 MED ORDER — TIOTROPIUM BROMIDE MONOHYDRATE 18 MCG IN CAPS
18.0000 ug | ORAL_CAPSULE | Freq: Every day | RESPIRATORY_TRACT | Status: DC
  Administered 2015-03-06 – 2015-03-07 (×2): 18 ug via RESPIRATORY_TRACT
  Filled 2015-03-06: qty 5

## 2015-03-06 MED ORDER — ATORVASTATIN CALCIUM 40 MG PO TABS
40.0000 mg | ORAL_TABLET | Freq: Every day | ORAL | Status: DC
  Administered 2015-03-06 – 2015-03-07 (×2): 40 mg via ORAL
  Filled 2015-03-06 (×2): qty 1

## 2015-03-06 MED ORDER — ACETAMINOPHEN 325 MG PO TABS: 650.0000 mg | ORAL_TABLET | ORAL | Status: DC | PRN

## 2015-03-06 MED ORDER — METOPROLOL SUCCINATE ER 25 MG PO TB24
12.5000 mg | ORAL_TABLET | Freq: Every day | ORAL | Status: DC
  Administered 2015-03-06: 12.5 mg via ORAL
  Filled 2015-03-06 (×2): qty 1

## 2015-03-06 MED ORDER — LEVOTHYROXINE SODIUM 25 MCG PO TABS
25.0000 ug | ORAL_TABLET | Freq: Every day | ORAL | Status: DC
  Administered 2015-03-06 – 2015-03-07 (×2): 25 ug via ORAL
  Filled 2015-03-06 (×2): qty 1

## 2015-03-06 MED ORDER — CEFAZOLIN SODIUM-DEXTROSE 2-3 GM-% IV SOLR
2.0000 g | Freq: Three times a day (TID) | INTRAVENOUS | Status: AC
  Administered 2015-03-06 (×2): 2 g via INTRAVENOUS
  Filled 2015-03-06 (×2): qty 50

## 2015-03-06 MED ORDER — SENNOSIDES-DOCUSATE SODIUM 8.6-50 MG PO TABS: 1.0000 | ORAL_TABLET | Freq: Every evening | ORAL | Status: DC | PRN

## 2015-03-06 MED ORDER — METFORMIN HCL ER 500 MG PO TB24
500.0000 mg | ORAL_TABLET | Freq: Every evening | ORAL | Status: DC
  Administered 2015-03-06 – 2015-03-07 (×2): 500 mg via ORAL
  Filled 2015-03-06 (×2): qty 1

## 2015-03-06 MED ORDER — PHENOL 1.4 % MT LIQD
1.0000 | OROMUCOSAL | Status: DC | PRN
Start: 2015-03-06 — End: 2015-03-07

## 2015-03-06 MED ORDER — INSULIN ASPART 100 UNIT/ML ~~LOC~~ SOLN: 0.0000 [IU] | Freq: Every day | SUBCUTANEOUS | Status: DC

## 2015-03-06 MED ORDER — GABAPENTIN 300 MG PO CAPS
300.0000 mg | ORAL_CAPSULE | Freq: Three times a day (TID) | ORAL | Status: DC
  Administered 2015-03-06 – 2015-03-07 (×5): 300 mg via ORAL
  Filled 2015-03-06 (×5): qty 1

## 2015-03-06 MED ORDER — FLUOXETINE HCL 20 MG PO CAPS
20.0000 mg | ORAL_CAPSULE | Freq: Every evening | ORAL | Status: DC
  Administered 2015-03-06 – 2015-03-07 (×2): 20 mg via ORAL
  Filled 2015-03-06 (×2): qty 1

## 2015-03-06 MED ORDER — SODIUM CHLORIDE 0.9% FLUSH: 3.0000 mL | INTRAVENOUS | Status: DC | PRN

## 2015-03-06 MED ORDER — ONDANSETRON HCL 4 MG/2ML IJ SOLN: 4.0000 mg | INTRAMUSCULAR | Status: DC | PRN

## 2015-03-06 MED ORDER — ZOLPIDEM TARTRATE 5 MG PO TABS: 5.0000 mg | ORAL_TABLET | Freq: Every evening | ORAL | Status: DC | PRN

## 2015-03-06 MED ORDER — DOCUSATE SODIUM 100 MG PO CAPS
100.0000 mg | ORAL_CAPSULE | Freq: Two times a day (BID) | ORAL | Status: DC
  Administered 2015-03-06 – 2015-03-07 (×3): 100 mg via ORAL
  Filled 2015-03-06 (×3): qty 1

## 2015-03-06 MED ORDER — SENNA 8.6 MG PO TABS
1.0000 | ORAL_TABLET | Freq: Two times a day (BID) | ORAL | Status: DC
  Administered 2015-03-06 – 2015-03-07 (×2): 8.6 mg via ORAL
  Filled 2015-03-06 (×3): qty 1

## 2015-03-06 MED ORDER — VITAMIN B-12 1000 MCG PO TABS
1000.0000 ug | ORAL_TABLET | Freq: Every day | ORAL | Status: DC
  Administered 2015-03-06 – 2015-03-07 (×2): 1000 ug via ORAL
  Filled 2015-03-06 (×2): qty 1

## 2015-03-06 MED ORDER — HYDROMORPHONE HCL 1 MG/ML IJ SOLN
0.5000 mg | INTRAMUSCULAR | Status: DC | PRN
  Administered 2015-03-06 – 2015-03-07 (×4): 1 mg via INTRAVENOUS
  Filled 2015-03-06 (×4): qty 1

## 2015-03-06 MED ORDER — POTASSIUM CHLORIDE IN NACL 20-0.9 MEQ/L-% IV SOLN
INTRAVENOUS | Status: DC
  Filled 2015-03-06: qty 1000

## 2015-03-06 MED ORDER — HYDROCODONE-ACETAMINOPHEN 5-325 MG PO TABS: 1.0000 | ORAL_TABLET | ORAL | Status: DC | PRN

## 2015-03-06 MED ORDER — MAGNESIUM CITRATE PO SOLN: 1.0000 | Freq: Once | ORAL | Status: DC | PRN

## 2015-03-06 MED ORDER — MENTHOL 3 MG MT LOZG: 1.0000 | LOZENGE | OROMUCOSAL | Status: DC | PRN

## 2015-03-06 MED ORDER — LISINOPRIL 20 MG PO TABS
20.0000 mg | ORAL_TABLET | Freq: Every day | ORAL | Status: DC
  Administered 2015-03-06: 20 mg via ORAL
  Filled 2015-03-06 (×2): qty 1

## 2015-03-06 MED ORDER — INSULIN ASPART 100 UNIT/ML ~~LOC~~ SOLN: 0.0000 [IU] | Freq: Three times a day (TID) | SUBCUTANEOUS | Status: DC

## 2015-03-06 MED ORDER — DOXAZOSIN MESYLATE 2 MG PO TABS
2.0000 mg | ORAL_TABLET | Freq: Every day | ORAL | Status: DC
  Administered 2015-03-06 – 2015-03-07 (×2): 2 mg via ORAL
  Filled 2015-03-06 (×2): qty 1

## 2015-03-06 MED ORDER — TAMSULOSIN HCL 0.4 MG PO CAPS
0.4000 mg | ORAL_CAPSULE | Freq: Every day | ORAL | Status: DC
Start: 2015-03-06 — End: 2015-03-07
  Administered 2015-03-06 – 2015-03-07 (×2): 0.4 mg via ORAL
  Filled 2015-03-06 (×2): qty 1

## 2015-03-06 MED FILL — Heparin Sodium (Porcine) Inj 1000 Unit/ML: INTRAMUSCULAR | Qty: 30 | Status: AC

## 2015-03-06 MED FILL — Sodium Chloride IV Soln 0.9%: INTRAVENOUS | Qty: 2000 | Status: AC

## 2015-03-06 NOTE — Care Management Note (Signed)
Case Management Note  Patient Details  Name: Trevor [name] MRN: [id number] Date of Birth: 08/05/1965  Subjective/Objective:                    Action/Plan: Patient was admitted for an L5-S1 PLIF.  Will follow for discharge needs.  Expected Discharge Date:                  Expected Discharge Plan:     In-House Referral:     Discharge planning Services     Post Acute Care Choice:    Choice offered to:     DME Arranged:    DME Agency:     HH Arranged:    HH Agency:     Status of Service:  In process, will continue to follow  Medicare Important Message Given:    Date Medicare IM Given:    Medicare IM give by:    Date Additional Medicare IM Given:    Additional Medicare Important Message give by:     If discussed at Long Length of Stay Meetings, dates discussed:    Additional CommentsAnda Kraft, RN 03/06/2015, 10:28 AM [phone]

## 2015-03-06 NOTE — Evaluation (Signed)
Occupational Therapy Evaluation Patient Details Name: Trevor [name] MRN: [id number] DOB: 1965-03-13 Today's Date: 03/06/2015    History of Present Illness Pt is a 50 y/o male who presents s/p L4-S1 PLIF on 03/05/15.   Clinical Impression   Pt reports he was independent with ADLs PTA. Currently pt is overall min guard for safety with functional mobility and ADLs with use of AE for LB ADLs. Educated on use of AE and provided pt with long handled sponge, long handled shoe horn, reacher, and sock aide. Began back, safety, and ADL education with pt. Pt able to recall 3/3 back precautions at start of session and maintain throughout functional activities. Pt planning to d/c home with 24/7 supervision from his mother. Pt would benefit from continued skilled OT to address established goals.    Follow Up Recommendations  No OT follow up;Supervision - Intermittent    Equipment Recommendations  3 in 1 bedside comode;Other (comment) (AE-already provided to pt)    Recommendations for Other Services       Precautions / Restrictions Precautions Precautions: Fall;Back Precaution Booklet Issued: No Precaution Comments: Pt able to recall 3/3 precautions at start of session. Reviewed all precautions in relation to ADL activities. Required Braces or Orthoses: Spinal Brace Spinal Brace: Lumbar corset;Applied in sitting position Restrictions Weight Bearing Restrictions: No      Mobility Bed Mobility Overal bed mobility: Needs Assistance Bed Mobility: Rolling;Sit to Sidelying Rolling: Min guard Sidelying to sit: Min assist     Sit to sidelying: Min assist General bed mobility comments: VC's for proper log roll technique. Light min assist for controlled descent of trunk to bed.  Transfers Overall transfer level: Needs assistance Equipment used: Rolling walker (2 wheeled) Transfers: Sit to/from Stand Sit to Stand: Min guard         General transfer comment: Min guard for safety x 2 with  sit to stand from chair. VCs for hand placement and technique.    Balance Overall balance assessment: No apparent balance deficits (not formally assessed)                                          ADL Overall ADL's : Needs assistance/impaired Eating/Feeding: Set up;Sitting   Grooming: Min guard;Wash/dry hands;Standing Grooming Details (indicate cue type and reason): Educated on use of cup for oral care. Upper Body Bathing: Set up;Sitting   Lower Body Bathing: Min guard;Sit to/from stand;With adaptive equipment Lower Body Bathing Details (indicate cue type and reason): Educated on use of long handled sponge and provided to pt. Upper Body Dressing : Set up;Sitting Upper Body Dressing Details (indicate cue type and reason): Pt able to doff back brace sitting EOB Lower Body Dressing: Min guard;Sit to/from stand Lower Body Dressing Details (indicate cue type and reason): Pt unable to cross foot over opposite knee. Educated on use of long handled shoe horn, reacher, and sock aide; pt able to return demo reacher and sock aide for donning/doffing socks and pants. Pt initially stating that he was going to wear flipflops upon return home; educated on wearing shoes with back for safety; pt verbalized understanding. Provided pt with AE practiced in session. Toilet Transfer: Min guard;Ambulation;BSC;RW (BSC over toilet) Toilet Transfer Details (indicate cue type and reason): Transfer simulated by sit to stand from chair. Pt able to stand at toilet to void. Toileting- Architect and Hygiene: Min guard;Sit to/from stand Toileting -  Clothing Manipulation Details (indicate cue type and reason): Pt able to demo reaching bottom without twisting.   Tub/Shower Transfer Details (indicate cue type and reason): Discussed use of 3 in 1 in tub; plan to practice next session. Functional mobility during ADLs: Min guard;Rolling walker General ADL Comments: No family present for OT eval. Pt  reports mom is a Chartered loss adjuster; educated on home safety and need to de-clutter space for safety.      Vision     Perception     Praxis      Pertinent Vitals/Pain Pain Assessment: 0-10 Pain Score: 9  Pain Location: Back Pain Descriptors / Indicators: Aching;Grimacing;Operative site guarding Pain Intervention(s): Limited activity within patient's tolerance;Monitored during session;Repositioned;Premedicated before session     Hand Dominance Right   Extremity/Trunk Assessment Upper Extremity Assessment Upper Extremity Assessment: Overall WFL for tasks assessed   Lower Extremity Assessment Lower Extremity Assessment: Defer to PT evaluation   Cervical / Trunk Assessment Cervical / Trunk Assessment: Other exceptions Cervical / Trunk Exceptions: Noted surgical incision and protruding area (cyst per pt) on anterior side of L shoulder   Communication Communication Communication: No difficulties   Cognition Arousal/Alertness: Awake/alert Behavior During Therapy: WFL for tasks assessed/performed Overall Cognitive Status: Within Functional Limits for tasks assessed                     General Comments       Exercises       Shoulder Instructions      Home Living Family/patient expects to be discharged to:: Private residence Living Arrangements: Parent Available Help at Discharge: Family;Available 24 hours/day Type of Home: House Home Access: Stairs to enter [employer] of Steps: 4 Entrance Stairs-Rails: None Home Layout: One level     Bathroom Shower/Tub: Tub/shower unit;Curtain Shower/tub characteristics: Engineer, building services: Standard Bathroom Accessibility: No   Home Equipment: Environmental consultant - 2 wheels (Is his mothers)   Additional Comments: Pt reports mother is a Chartered loss adjuster and their house is very cluttered. Thinks he could manage a RW in house but not in bathroom.      Prior Functioning/Environment Level of Independence: Independent              OT Diagnosis: Generalized weakness;Acute pain   OT Problem List: Decreased safety awareness;Decreased knowledge of use of DME or AE;Decreased knowledge of precautions;Pain;Obesity   OT Treatment/Interventions: Self-care/ADL training;Energy conservation;DME and/or AE instruction;Therapeutic activities;Patient/family education    OT Goals(Current goals can be found in the care plan section) Acute Rehab OT Goals Patient Stated Goal: Return home at d/c OT Goal Formulation: With patient Time For Goal Achievement: 03/20/15 Potential to Achieve Goals: Good ADL Goals Pt Will Perform Grooming: with modified independence;standing Pt Will Perform Upper Body Dressing: with modified independence;sitting Pt Will Perform Lower Body Dressing: with modified independence;sit to/from stand Pt Will Perform Tub/Shower Transfer: Tub transfer;with supervision;ambulating;3 in 1;rolling walker Additional ADL Goal #1: Pt will independently don/doff back brace as precursor to ADL activities. Additional ADL Goal #2: Pt will independently maintain back precautions throughout ADL activity.  OT Frequency: Min 2X/week   Barriers to D/C: Inaccessible home environment  Per pt, home is very cluttered and RW will not fit in bathroom.       Co-evaluation              End of Session Equipment Utilized During Treatment: Rolling walker;Back brace;Gait belt Nurse Communication: Mobility status  Activity Tolerance: Patient tolerated treatment well (but in a lot of pain) Patient left:  in bed;with call bell/phone within reach   Time: 1432-1508 OT Time Calculation (min): 36 min Charges:  OT General Charges $OT Visit: 1 Procedure OT Evaluation $OT Eval Moderate Complexity: 1 Procedure OT Treatments $Self Care/Home Management : 8-22 mins G-Codes:     Gaye Alken M.S., OTR/L Pager: [phone]  03/06/2015, 3:28 PM

## 2015-03-06 NOTE — Telephone Encounter (Signed)
LM X 2 days, will mail pt letter

## 2015-03-06 NOTE — Evaluation (Signed)
Physical Therapy Evaluation Patient Details Name: Trevor [name] MRN: [id number] DOB: 19-Mar-1965 Today's Date: 03/06/2015   History of Present Illness  Pt is a 50 y/o male who presents s/p L4-S1 PLIF on 03/05/15.  Clinical Impression  Pt admitted with above diagnosis. Pt currently with functional limitations due to the deficits listed below (see PT Problem List). At the time of PT eval pt was able to perform transfers and ambulation with close guard for safety. Pt will benefit from skilled PT to increase their independence and safety with mobility to allow discharge to the venue listed below.       Follow Up Recommendations No PT follow up;Supervision for mobility/OOB    Equipment Recommendations  3in1 (PT);Rolling walker with 5" wheels    Recommendations for Other Services       Precautions / Restrictions Precautions Precautions: Fall;Back Precaution Booklet Issued: Yes (comment) Precaution Comments: Handout reviewed, and pt was cued for back precautions during functional mobility Required Braces or Orthoses: Spinal Brace Spinal Brace: Lumbar corset;Applied in sitting position Restrictions Weight Bearing Restrictions: No      Mobility  Bed Mobility Overal bed mobility: Needs Assistance Bed Mobility: Rolling;Sidelying to Sit Rolling: Min guard Sidelying to sit: Min assist       General bed mobility comments: VC's for proper log roll technique. Pt was able to transition to EOB with assist for trunk elevation.   Transfers Overall transfer level: Needs assistance   Transfers: Sit to/from Stand Sit to Stand: Min guard         General transfer comment: Hands-on guarding as pt powered-up to full standing position.   Ambulation/Gait Ambulation/Gait assistance: Min guard;Supervision Ambulation Distance (Feet): 200 Feet Assistive device: Rolling walker (2 wheeled) Gait Pattern/deviations: Step-through pattern;Decreased stride length Gait velocity: Decreased Gait  velocity interpretation: Below normal speed for age/gender General Gait Details: Min guard initially progressing to supervision for safety. Pt moving slow and guarded overall with small step/stride length.   Stairs            Wheelchair Mobility    Modified Rankin (Stroke Patients Only)       Balance Overall balance assessment: No apparent balance deficits (not formally assessed)                                           Pertinent Vitals/Pain Pain Assessment: 0-10 Pain Score: 6  Pain Location: Back  Pain Descriptors / Indicators: Operative site guarding;Aching Pain Intervention(s): Limited activity within patient's tolerance;Monitored during session;Repositioned    Home Living Family/patient expects to be discharged to:: Private residence Living Arrangements: Parent Available Help at Discharge: Family;Available 24 hours/day Type of Home: House Home Access: Stairs to enter Entrance Stairs-Rails: None Entrance Stairs-Number of Steps: 4 Home Layout: One level Home Equipment: Environmental consultant - 2 wheels (Is his mothers)      Prior Function Level of Independence: Independent               Hand Dominance        Extremity/Trunk Assessment   Upper Extremity Assessment: Defer to OT evaluation           Lower Extremity Assessment: Generalized weakness      Cervical / Trunk Assessment: Other exceptions  Communication   Communication: No difficulties  Cognition Arousal/Alertness: Awake/alert Behavior During Therapy: WFL for tasks assessed/performed Overall Cognitive Status: Within Functional Limits for tasks assessed  General Comments      Exercises        Assessment/Plan    PT Assessment Patient needs continued PT services  PT Diagnosis Difficulty walking;Acute pain   PT Problem List Decreased strength;Decreased range of motion;Decreased activity tolerance;Decreased balance;Decreased mobility;Decreased  safety awareness;Decreased knowledge of use of DME;Decreased knowledge of precautions;Pain  PT Treatment Interventions DME instruction;Stair training;Functional mobility training;Gait training;Therapeutic activities;Therapeutic exercise;Neuromuscular re-education;Patient/family education   PT Goals (Current goals can be found in the Care Plan section) Acute Rehab PT Goals Patient Stated Goal: Return home at d/c PT Goal Formulation: With patient Time For Goal Achievement: 03/13/15 Potential to Achieve Goals: Good    Frequency Min 5X/week   Barriers to discharge        Co-evaluation               End of Session Equipment Utilized During Treatment: Back brace Activity Tolerance: Patient tolerated treatment well Patient left: in chair;with call bell/phone within reach Nurse Communication: Mobility status         Time: 1610-9604 PT Time Calculation (min) (ACUTE ONLY): 27 min   Charges:   PT Evaluation $PT Eval Moderate Complexity: 1 Procedure PT Treatments $Gait Training: 8-22 mins   PT G Codes:        Conni Slipper 2015/03/27, 3:15 PM   Conni Slipper, PT, DPT Acute Rehabilitation Services Pager: [phone]

## 2015-03-06 NOTE — Progress Notes (Signed)
Orthopedic Tech Progress Note Patient Details:  Trevor [name] [birth year] [id number]  Patient ID: Trevor [name], male   DOB: [birth year], 50 y.o.   MRN: [id number]   [employer] for lumbar brace. 03/06/2015, 11:23 AM

## 2015-03-06 NOTE — Progress Notes (Signed)
Pt urinary catheter removed at 0545 with no difficulties, scant bright red drainage post removal. Pt due to void at 1145. Pt tolerating PO meds and food well, Pt's diet is advanced to carb modified diet per MD order. RN will continue to monitor.

## 2015-03-07 LAB — GLUCOSE, CAPILLARY
GLUCOSE-CAPILLARY: 118 mg/dL — AB (ref 65–99)
Glucose-Capillary: 162 mg/dL — ABNORMAL HIGH (ref 65–99)
Glucose-Capillary: 181 mg/dL — ABNORMAL HIGH (ref 65–99)

## 2015-03-07 LAB — HEMOGLOBIN A1C
Hgb A1c MFr Bld: 6.4 % — ABNORMAL HIGH (ref 4.8–5.6)
MEAN PLASMA GLUCOSE: 137 mg/dL

## 2015-03-07 MED ORDER — OXYCODONE-ACETAMINOPHEN 5-325 MG PO TABS: 1.0000 | ORAL_TABLET | Freq: Four times a day (QID) | ORAL | Status: DC | PRN

## 2015-03-07 MED ORDER — CYCLOBENZAPRINE HCL 10 MG PO TABS: 10.0000 mg | ORAL_TABLET | Freq: Three times a day (TID) | ORAL | Status: DC | PRN

## 2015-03-07 NOTE — Care Management (Signed)
ED CM  Received called concerning rolling walker and 3 n 1  Chair prior to discharge. CM spoke with patient regarding recommendations for DME, patient has Medicaid,Explained that choices may be limited for DME companies for hospital delivery patient agreeable with using [facility] for DME. Walker and 3 n 1 chair delivered to room prior to discharge. No further CM needs identified.

## 2015-03-07 NOTE — Progress Notes (Signed)
Occupational Therapy Treatment Patient Details Name: [name] MRN: [id number] DOB: 06/29/1965 Today's Date: 03/07/2015    History of present illness Pt is a 50 y/o male who presents s/p L4-S1 PLIF on 03/05/15.   OT comments  Pt making good progress toward OT goals. Pt able to demonstrated donning/doffing brace sitting EOB with set up. Educated pt on tub transfer technique with RW and 3 in 1; he was able to return demo x2. Pt able to recall 2/3 back precautions; educated on all precautions. Pt noted to be maintaining precautions throughout functional activities during session. D/c plan remains appropriate. Will continue to follow acutely.    Follow Up Recommendations  No OT follow up;Supervision - Intermittent    Equipment Recommendations  3 in 1 bedside comode;Other (comment) (AE-pt has in room)    Recommendations for Other Services      Precautions / Restrictions Precautions Precautions: Fall;Back Precaution Booklet Issued: No Precaution Comments: Pt able to recall 2/3 back precautions (no twisting). Educated pt on all precautions. Required Braces or Orthoses: Spinal Brace Spinal Brace: Lumbar corset;Applied in sitting position Restrictions Weight Bearing Restrictions: No       Mobility Bed Mobility Overal bed mobility: Needs Assistance Bed Mobility: Rolling;Sidelying to Sit Rolling: Supervision Sidelying to sit: Min guard       General bed mobility comments: Pt sitting EOB upon arrival.  Transfers Overall transfer level: Needs assistance Equipment used: Rolling walker (2 wheeled) Transfers: Sit to/from Stand Sit to Stand: Min guard         General transfer comment: Min guard for safety. VCs for hand placement.    Balance Overall balance assessment: No apparent balance deficits (not formally assessed)                                 ADL Overall ADL's : Needs assistance/impaired                 Upper Body Dressing : Set  up;Sitting Upper Body Dressing Details (indicate cue type and reason): Pt able to demo doff and donning brace with set up.             Tub/ Shower Transfer: Ambulation;3 in Electrical engineer Details (indicate cue type and reason): Educated pt on use of 3 in 1 in tub as a seat and tub transfer technique with use of RW; pt able to return demo x 2 with supervision for safety. Discussed having supervision with tub transfers initially; pt verbalized understanding. Functional mobility during ADLs: Rolling walker;Min guard General ADL Comments: Family present for OT session. Educated pt on home safety, back precautions in relation to ADL/IADL activities.      Vision                     Perception     Praxis      Cognition   Behavior During Therapy: WFL for tasks assessed/performed Overall Cognitive Status: Within Functional Limits for tasks assessed                       Extremity/Trunk Assessment               Exercises     Shoulder Instructions       General Comments      Pertinent Vitals/ Pain       Pain Assessment: 0-10 Pain Score: 3  Pain Location:  back Pain Descriptors / Indicators: Sore Pain Intervention(s): Limited activity within patient's tolerance;Monitored during session  Home Living                                          Prior Functioning/Environment              Frequency Min 2X/week     Progress Toward Goals  OT Goals(current goals can now be found in the care plan section)  Progress towards OT goals: Progressing toward goals  Acute Rehab OT Goals Patient Stated Goal: Return home at d/c  Plan Discharge plan remains appropriate    Co-evaluation                 End of Session Equipment Utilized During Treatment: Gait belt;Rolling walker;Back brace   Activity Tolerance Patient tolerated treatment well   Patient Left in bed;with call bell/phone within reach  (sitting EOB)   Nurse Communication          Time: 3086-5784517 OT Time Calculation (min): 16 min  Charges: OT General Charges $OT Visit: 1 Procedure OT Treatments $Self Care/Home Management : 8-22 mins  Gaye Alken M.S., OTR/L Pager: [phone]  03/07/2015, 3:23 PM

## 2015-03-07 NOTE — Progress Notes (Signed)
Physical Therapy Treatment Patient Details Name: Trevor [name] MRN: [id number] DOB: 1965-05-22 Today's Date: 03/07/2015    History of Present Illness Pt is a 50 y/o male who presents s/p L4-S1 PLIF on 03/05/15.    PT Comments    Pt progressing towards physical therapy goals. Was able to perform transfers and ambulation this session with increased independence. Pt able to negotiate 3 stairs with B hand rails for support. Notified RN and CM of need for 3-in-1 and RW at d/c. Will continue to follow and progress as able per POC.   Follow Up Recommendations  No PT follow up;Supervision for mobility/OOB     Equipment Recommendations  3in1 (PT);Rolling walker with 5" wheels    Recommendations for Other Services       Precautions / Restrictions Precautions Precautions: Fall;Back Precaution Booklet Issued: Yes (comment) Precaution Comments: Pt able to recall 3/3 precautions. Was cued during functional mobility.  Required Braces or Orthoses: Spinal Brace Spinal Brace: Lumbar corset;Applied in sitting position Restrictions Weight Bearing Restrictions: No    Mobility  Bed Mobility Overal bed mobility: Needs Assistance Bed Mobility: Rolling;Sidelying to Sit Rolling: Supervision Sidelying to sit: Min guard       General bed mobility comments: VC's for proper log roll technique.   Transfers Overall transfer level: Needs assistance Equipment used: Rolling walker (2 wheeled) Transfers: Sit to/from Stand Sit to Stand: Min guard         General transfer comment: Hands-on guarding as pt powered-up to full standing position.   Ambulation/Gait Ambulation/Gait assistance: Min guard;Supervision Ambulation Distance (Feet): 500 Feet Assistive device: Rolling walker (2 wheeled) Gait Pattern/deviations: Step-through pattern;Decreased stride length Gait velocity: Decreased Gait velocity interpretation: Below normal speed for age/gender General Gait Details: Min guard initially for  safety progressing to supervision. Slow and guarded due to pain during first 5' or so, however pt was able to demonstrate a more natural gait pattern as pain decreased.    Stairs Stairs: Yes Stairs assistance: Supervision Stair Management: Two rails;Step to pattern;Forwards Number of Stairs: 3 General stair comments: VC's for general safety and sequencing.  Wheelchair Mobility    Modified Rankin (Stroke Patients Only)       Balance Overall balance assessment: No apparent balance deficits (not formally assessed)                                  Cognition Arousal/Alertness: Awake/alert Behavior During Therapy: WFL for tasks assessed/performed Overall Cognitive Status: Within Functional Limits for tasks assessed                      Exercises      General Comments        Pertinent Vitals/Pain Pain Assessment: 0-10 Pain Score: 5  Pain Location: back Pain Descriptors / Indicators: Operative site guarding Pain Intervention(s): Limited activity within patient's tolerance;Monitored during session;Repositioned    Home Living                      Prior Function            PT Goals (current goals can now be found in the care plan section) Acute Rehab PT Goals Patient Stated Goal: Return home at d/c PT Goal Formulation: With patient Time For Goal Achievement: 03/13/15 Potential to Achieve Goals: Good Progress towards PT goals: Progressing toward goals    Frequency  Min 5X/week    PT  Plan Current plan remains appropriate    Co-evaluation             End of Session Equipment Utilized During Treatment: Back brace Activity Tolerance: Patient tolerated treatment well Patient left: in chair;with call bell/phone within reach     Time: 1152-1214 PT Time Calculation (min) (ACUTE ONLY): 22 min  Charges:  $Gait Training: 8-22 mins                    G Codes:      Conni Slipper 03/24/15, 1:38 PM   Conni Slipper, PT,  DPT Acute Rehabilitation Services Pager: [phone]

## 2015-03-07 NOTE — Progress Notes (Signed)
Discharge instructions reviewed with patient/family. RXs given. All questions answered at this time. Patient is waiting for 3 in 1 commode and walker.  Spoke to [place], [place]. Awaiting for transport from family.   Sim Boast, RN

## 2015-03-07 NOTE — Discharge Instructions (Signed)

## 2015-03-07 NOTE — Progress Notes (Signed)
Pt ambulated to chair with walker and brace.  Will continue to monitor.   Estanislado Emms, RN

## 2015-03-07 NOTE — Discharge Summary (Signed)
Physician Discharge Summary  Patient ID: Trevor [name] MRN: [id number] DOB/AGE: 08-23-1965 50 y.o.  Admit date: 03/05/2015 Discharge date: 03/07/2015  Admission Diagnoses:lumbar spondylolisthesis L5/S1, Lumbar spondylolysis L5, Osteoarthritis with radiculopathy L4   Discharge Diagnoses: lumbar spondylolisthesis L5/S1, Lumbar spondylolysis L5, Osteoarthritis with radiculopathy L4  Active Problems:   Spondylolisthesis of lumbar region   Discharged Condition: good  Hospital Course: [name] was Admitted to the hospital for a lumbar fusion at L4/5, and L5/S1. Postoperatively he has been walking, eating, and voiding well. His wound is clean, dry, and without signs of infection. He has been cleared by both physical therapy, and occupational therapy.   Treatments: surgery: L5 Gill procedure L4, L5 nerve root decompression via an L4 laminecotmy, bilateral inferior L4 facetectomies PLIF L5/S1, 11x98mm Synthes cages(opal) with autograft morsels Posterolateral arthrodesis L4-S1 with autograft morsels   Discharge Exam: Blood pressure 129/70, pulse 74, temperature 98.6 F (37 C), temperature source Oral, resp. rate 20, weight 107.956 kg (238 lb), SpO2 96 %. General appearance: alert, cooperative, appears stated age and no distress Neurologic: Alert and oriented X 3, normal strength and tone. Normal symmetric reflexes. Normal coordination and gait  Disposition: 06-Home-Health Care Svc lumbar spondylolisthesis Discharge Instructions    For home use only DME Bedside commode    Complete by:  As directed             Medication List    TAKE these medications        acetaminophen 650 MG CR tablet  Commonly known as:  TYLENOL  Take 1,300 mg by mouth every 8 (eight) hours as needed for pain.     atorvastatin 40 MG tablet  Commonly known as:  LIPITOR  Take 40 mg by mouth daily.     cloNIDine 0.3 MG tablet  Commonly known as:  CATAPRES  Take 0.3 mg by mouth at bedtime.     cyclobenzaprine 10 MG tablet  Commonly known as:  FLEXERIL  Take 1 tablet (10 mg total) by mouth 3 (three) times daily as needed for muscle spasms.     doxazosin 2 MG tablet  Commonly known as:  CARDURA  Take 2 mg by mouth daily.     FLUoxetine 20 MG capsule  Commonly known as:  PROZAC  Take 20 mg by mouth every evening.     gabapentin 300 MG capsule  Commonly known as:  NEURONTIN  Take 300 mg by mouth 3 (three) times daily.     levothyroxine 25 MCG tablet  Commonly known as:  SYNTHROID, LEVOTHROID  Take 25 mcg by mouth daily before breakfast.     lisinopril 20 MG tablet  Commonly known as:  PRINIVIL,ZESTRIL  Take 20 mg by mouth daily.     metFORMIN 500 MG 24 hr tablet  Commonly known as:  GLUCOPHAGE-XR  Take 500 mg by mouth every evening.     metoprolol succinate 25 MG 24 hr tablet  Commonly known as:  TOPROL-XL  Take 12.5 mg by mouth daily.     oxyCODONE-acetaminophen 5-325 MG tablet  Commonly known as:  PERCOCET/ROXICET  Take 1-2 tablets by mouth every 6 (six) hours as needed for moderate pain.     tamsulosin 0.4 MG Caps capsule  Commonly known as:  FLOMAX  Take 0.4 mg by mouth daily.     tiotropium 18 MCG inhalation capsule  Commonly known as:  SPIRIVA  Place 18 mcg into inhaler and inhale daily.     VITAMIN B 12 PO  Take 1 tablet  by mouth daily.         Signed: Oneita Allmon L 03/07/2015, 6:03 PM

## 2015-03-14 ENCOUNTER — Encounter (HOSPITAL_COMMUNITY): Payer: Self-pay | Admitting: Neurosurgery

## 2015-06-26 ENCOUNTER — Other Ambulatory Visit (HOSPITAL_COMMUNITY): Payer: Self-pay | Admitting: Respiratory Therapy

## 2015-06-26 DIAGNOSIS — J441 Chronic obstructive pulmonary disease with (acute) exacerbation: Secondary | ICD-10-CM

## 2015-07-02 ENCOUNTER — Inpatient Hospital Stay (HOSPITAL_COMMUNITY): Admission: RE | Admit: 2015-07-02 | Payer: Medicaid Other | Source: Ambulatory Visit

## 2015-07-03 ENCOUNTER — Ambulatory Visit: Payer: Medicaid Other | Admitting: Cardiology

## 2015-07-03 NOTE — Progress Notes (Unsigned)
Clinical Summary Mr. Garner NashDaniels is a 50 y.o.male seen today for follow up of the following medical problems.   1. Syncope - first episode 2.5 years while driving, blacked out. He does not recall the details. Does not recall a prodrome. Did not have any recurrent symptoms until 3-4 weeks ago.  - June 19, 2014 [facility] at [facility]. Episode while working on scooter at home. Stood up to walk in house and passed out. Felt lightheaded. No palpitations, no chest pain. Seen in [facility], labs suggested dehydration (Cr 1.7 up from 0.6), BUN 21. EKG reports abnormal lateral T waves however there is arm lead reversal as the cause. Repeat episode 1 week later, but does not remember details. Seen [facility] yesterday for mechanical fall, he specifically denies syncope.   - since last visit he has increased his oral intake. Denies any recurrrent syncope though has had some mechanical falls  2. HTN - compliant with meds, though he is unsure if he has taken yet today Past Medical History  Diagnosis Date  . Diabetes mellitus   . Hypertension   . Hepatitis C   . High cholesterol   . MI, old   . COPD (chronic obstructive pulmonary disease) (HCC)   . Shortness of breath dyspnea   . Hypothyroidism   . Anxiety   . Depression   . Arthritis      No Known Allergies   Current Outpatient Prescriptions  Medication Sig Dispense Refill  . acetaminophen (TYLENOL) 650 MG CR tablet Take 1,300 mg by mouth every 8 (eight) hours as needed for pain.    Marland Kitchen. atorvastatin (LIPITOR) 40 MG tablet Take 40 mg by mouth daily.    . cloNIDine (CATAPRES) 0.3 MG tablet Take 0.3 mg by mouth at bedtime.    . Cyanocobalamin (VITAMIN B 12 PO) Take 1 tablet by mouth daily.    . cyclobenzaprine (FLEXERIL) 10 MG tablet Take 1 tablet (10 mg total) by mouth 3 (three) times daily as needed for muscle spasms. 60 tablet 0  . doxazosin (CARDURA) 2 MG tablet Take 2 mg by mouth daily.    Marland Kitchen. FLUoxetine (PROZAC) 20 MG capsule Take  20 mg by mouth every evening.    . gabapentin (NEURONTIN) 300 MG capsule Take 300 mg by mouth 3 (three) times daily.    Marland Kitchen. levothyroxine (SYNTHROID, LEVOTHROID) 25 MCG tablet Take 25 mcg by mouth daily before breakfast.    . lisinopril (PRINIVIL,ZESTRIL) 20 MG tablet Take 20 mg by mouth daily.    . metFORMIN (GLUCOPHAGE-XR) 500 MG 24 hr tablet Take 500 mg by mouth every evening.    . metoprolol succinate (TOPROL-XL) 25 MG 24 hr tablet Take 12.5 mg by mouth daily.    Marland Kitchen. oxyCODONE-acetaminophen (PERCOCET/ROXICET) 5-325 MG tablet Take 1-2 tablets by mouth every 6 (six) hours as needed for moderate pain. 70 tablet 0  . tamsulosin (FLOMAX) 0.4 MG CAPS capsule Take 0.4 mg by mouth daily.    Marland Kitchen. tiotropium (SPIRIVA) 18 MCG inhalation capsule Place 18 mcg into inhaler and inhale daily.     No current facility-administered medications for this visit.     Past Surgical History  Procedure Laterality Date  . Wrist surgery    . Knee surgery    . Hernia repair    . Laparotomy N/A 08/05/2014    Procedure: EXPLORATORY LAPAROTOMY, SMALL BOWEL RESECTION;  Surgeon: Manus RuddMatthew Tsuei, MD;  Location: [facility] OR;  Service: General;  Laterality: N/A;  .  Finger surgery       No Known Allergies    Family History  Problem Relation Age of Onset  . Alcohol abuse Father   . Alcohol abuse Mother   . Alcohol abuse Brother   . Arthritis Father   . Arthritis Mother   . Diabetes Mother   . Heart disease Father   . Heart disease Mother   . Hypertension Father   . Hypertension Mother   . Hypertension Brother      Social History Mr. Garner NashDaniels reports that he has been smoking Cigarettes.  He started smoking about 33 years ago. He has a 15 pack-year smoking history. He has never used smokeless tobacco. Mr. Garner NashDaniels reports that he does not drink alcohol.   Review of Systems CONSTITUTIONAL: No weight loss, fever, chills, weakness or fatigue.  HEENT: Eyes: No visual loss, blurred vision, double vision or yellow sclerae.No  hearing loss, sneezing, congestion, runny nose or sore throat.  SKIN: No rash or itching.  CARDIOVASCULAR:  RESPIRATORY: No shortness of breath, cough or sputum.  GASTROINTESTINAL: No anorexia, nausea, vomiting or diarrhea. No abdominal pain or blood.  GENITOURINARY: No burning on urination, no polyuria NEUROLOGICAL: No headache, dizziness, syncope, paralysis, ataxia, numbness or tingling in the extremities. No change in bowel or bladder control.  MUSCULOSKELETAL: No muscle, back pain, joint pain or stiffness.  LYMPHATICS: No enlarged nodes. No history of splenectomy.  PSYCHIATRIC: No history of depression or anxiety.  ENDOCRINOLOGIC: No reports of sweating, cold or heat intolerance. No polyuria or polydipsia.  Marland Kitchen.   Physical Examination There were no vitals filed for this visit. There were no vitals filed for this visit.  Gen: resting comfortably, no acute distress HEENT: no scleral icterus, pupils equal round and reactive, no palptable cervical adenopathy,  CV Resp: Clear to auscultation bilaterally GI: abdomen is soft, non-tender, non-distended, normal bowel sounds, no hepatosplenomegaly MSK: extremities are warm, no edema.  Skin: warm, no rash Neuro:  no focal deficits Psych: appropriate affect   Diagnostic Studies  09/2014 [facility] - LVEF 60-65%, abnormal diastolic function   09/2014 Carotid US - <50% bilateral disease  Assessment and Plan  1. Syncope - approximately 3 episodes over the last 2.5 years. Most recently episode occurred after standing up from working on a motor scooter at home. Significantly elevated Cr suggests he was dehdydrate at the time. He drinks a gallon of tea of day, explained caffeine is a diuretic and that he is actually dehydrating him. His pupils are pinpoint and he has recent increase in his hydrocodone, I suspect some opiate induced vasodilation may have contributed to the episode - no recurrent episodes since increasing his oral intake, we  will continue to monitor  2. HTN - elevated in clinic however he is unsure if he took his meds today. He will submit a bp log in 1 week   F/u 4 months      Antoine PocheJonathan F. Shoshana Johal, M.D., F.A.C.C.

## 2015-07-10 ENCOUNTER — Ambulatory Visit: Payer: Medicaid Other | Admitting: Cardiology

## 2015-07-24 ENCOUNTER — Ambulatory Visit: Payer: Medicaid Other | Admitting: Cardiology

## 2015-07-31 ENCOUNTER — Ambulatory Visit: Payer: Medicaid Other | Admitting: Cardiology

## 2015-07-31 ENCOUNTER — Encounter: Payer: Self-pay | Admitting: Cardiology

## 2015-07-31 DIAGNOSIS — R0989 Other specified symptoms and signs involving the circulatory and respiratory systems: Secondary | ICD-10-CM

## 2015-07-31 NOTE — Progress Notes (Unsigned)
Clinical Summary [name] is a 50 y.o.male seen today for follow up of the following medical problems.   1. Syncope - first episode 2.5 years while driving, blacked out. He does not recall the details. Does not recall a prodrome. Did not have any recurrent symptoms until 3-4 weeks ago.  - June 19, 2014 seen at [name]. Episode while working on scooter at home. Stood up to walk in house and passed out. Felt lightheaded. No palpitations, no chest pain. Seen in [name], labs suggested dehydration (Cr 1.7 up from 0.6), BUN 21. EKG reports abnormal lateral T waves however there is arm lead reversal as the cause. Repeat episode 1 week later, but does not remember details. Seen in [name] yesterday for mechanical fall, he specifically denies syncope.   - since last visit he has increased his oral intake. Denies any recurrrent syncope though has had some mechanical falls  2. HTN - compliant with meds, though he is unsure if he has taken yet today Past Medical History  Diagnosis Date  . Diabetes mellitus   . Hypertension   . Hepatitis C   . High cholesterol   . MI, old   . COPD (chronic obstructive pulmonary disease) (HCC)   . Shortness of breath dyspnea   . Hypothyroidism   . Anxiety   . Depression   . Arthritis      No Known Allergies   Current Outpatient Prescriptions  Medication Sig Dispense Refill  . acetaminophen (TYLENOL) 650 MG CR tablet Take 1,300 mg by mouth every 8 (eight) hours as needed for pain.    Marland Kitchen. atorvastatin (LIPITOR) 40 MG tablet Take 40 mg by mouth daily.    . cloNIDine (CATAPRES) 0.3 MG tablet Take 0.3 mg by mouth at bedtime.    . Cyanocobalamin (VITAMIN B 12 PO) Take 1 tablet by mouth daily.    . cyclobenzaprine (FLEXERIL) 10 MG tablet Take 1 tablet (10 mg total) by mouth 3 (three) times daily as needed for muscle spasms. 60 tablet 0  . doxazosin (CARDURA) 2 MG tablet Take 2 mg by mouth daily.    Marland Kitchen. FLUoxetine (PROZAC) 20 MG capsule Take  20 mg by mouth every evening.    . gabapentin (NEURONTIN) 300 MG capsule Take 300 mg by mouth 3 (three) times daily.    Marland Kitchen. levothyroxine (SYNTHROID, LEVOTHROID) 25 MCG tablet Take 25 mcg by mouth daily before breakfast.    . lisinopril (PRINIVIL,ZESTRIL) 20 MG tablet Take 20 mg by mouth daily.    . metFORMIN (GLUCOPHAGE-XR) 500 MG 24 hr tablet Take 500 mg by mouth every evening.    . metoprolol succinate (TOPROL-XL) 25 MG 24 hr tablet Take 12.5 mg by mouth daily.    Marland Kitchen. oxyCODONE-acetaminophen (PERCOCET/ROXICET) 5-325 MG tablet Take 1-2 tablets by mouth every 6 (six) hours as needed for moderate pain. 70 tablet 0  . tamsulosin (FLOMAX) 0.4 MG CAPS capsule Take 0.4 mg by mouth daily.    Marland Kitchen. tiotropium (SPIRIVA) 18 MCG inhalation capsule Place 18 mcg into inhaler and inhale daily.     No current facility-administered medications for this visit.     Past Surgical History  Procedure Laterality Date  . Wrist surgery    . Knee surgery    . Hernia repair    . Laparotomy N/A 08/05/2014    Procedure: EXPLORATORY LAPAROTOMY, SMALL BOWEL RESECTION;  Surgeon: Manus RuddMatthew Tsuei, MD;  Location: [facility] OR;  Service: General;  Laterality: N/A;  .  Finger surgery       No Known Allergies    Family History  Problem Relation Age of Onset  . Alcohol abuse Father   . Alcohol abuse Mother   . Alcohol abuse Brother   . Arthritis Father   . Arthritis Mother   . Diabetes Mother   . Heart disease Father   . Heart disease Mother   . Hypertension Father   . Hypertension Mother   . Hypertension Brother      Social History Trevor [name] reports that he has been smoking Cigarettes.  He started smoking about 33 years ago. He has a 15 pack-year smoking history. He has never used smokeless tobacco. Trevor [name] reports that he does not drink alcohol.   Review of Systems CONSTITUTIONAL: No weight loss, fever, chills, weakness or fatigue.  HEENT: Eyes: No visual loss, blurred vision, double vision or yellow sclerae.No  hearing loss, sneezing, congestion, runny nose or sore throat.  SKIN: No rash or itching.  CARDIOVASCULAR:  RESPIRATORY: No shortness of breath, cough or sputum.  GASTROINTESTINAL: No anorexia, nausea, vomiting or diarrhea. No abdominal pain or blood.  GENITOURINARY: No burning on urination, no polyuria NEUROLOGICAL: No headache, dizziness, syncope, paralysis, ataxia, numbness or tingling in the extremities. No change in bowel or bladder control.  MUSCULOSKELETAL: No muscle, back pain, joint pain or stiffness.  LYMPHATICS: No enlarged nodes. No history of splenectomy.  PSYCHIATRIC: No history of depression or anxiety.  ENDOCRINOLOGIC: No reports of sweating, cold or heat intolerance. No polyuria or polydipsia.  Marland Kitchen   Physical Examination There were no vitals filed for this visit. There were no vitals filed for this visit.  Gen: resting comfortably, no acute distress HEENT: no scleral icterus, pupils equal round and reactive, no palptable cervical adenopathy,  CV Resp: Clear to auscultation bilaterally GI: abdomen is soft, non-tender, non-distended, normal bowel sounds, no hepatosplenomegaly MSK: extremities are warm, no edema.  Skin: warm, no rash Neuro:  no focal deficits Psych: appropriate affect   Diagnostic Studies 09/2014 [facility] - LVEF 60-65%, abnormal diastolic function   09/2014 Carotid US - <50% bilateral disease    Assessment and Plan  1. Syncope - approximately 3 episodes over the last 2.5 years. Most recently episode occurred after standing up from working on a motor scooter at home. Significantly elevated Cr suggests he was dehdydrated at the time. He drinks a gallon of tea of day, explained caffeine is a diuretic and that he is actually dehydrating him. His pupils are pinpoint and he has recent increase in his hydrocodone, I suspect some opiate induced vasodilation may have contributed to the episode - no recurrent episodes since increasing his oral intake,  we will continue to monitor  2. HTN - elevated in clinic however he is unsure if he took his meds today. He will submit a bp log in 1 week   F/u 4 months      Antoine Poche, M.D., F.A.C.C.

## 2015-09-07 ENCOUNTER — Ambulatory Visit: Payer: Medicaid Other

## 2015-09-08 ENCOUNTER — Encounter: Payer: Self-pay | Admitting: Gastroenterology

## 2015-10-01 ENCOUNTER — Encounter: Payer: Self-pay | Admitting: Nurse Practitioner

## 2015-10-01 ENCOUNTER — Ambulatory Visit (INDEPENDENT_AMBULATORY_CARE_PROVIDER_SITE_OTHER): Payer: Medicaid Other | Admitting: Nurse Practitioner

## 2015-10-01 ENCOUNTER — Other Ambulatory Visit: Payer: Self-pay

## 2015-10-01 DIAGNOSIS — R894 Abnormal immunological findings in specimens from other organs, systems and tissues: Secondary | ICD-10-CM | POA: Diagnosis not present

## 2015-10-01 DIAGNOSIS — R768 Other specified abnormal immunological findings in serum: Secondary | ICD-10-CM | POA: Insufficient documentation

## 2015-10-01 DIAGNOSIS — B1920 Unspecified viral hepatitis C without hepatic coma: Secondary | ICD-10-CM

## 2015-10-01 NOTE — Progress Notes (Signed)
Primary Care Physician:  Trevor GrippeJames Kim, MD Primary Gastroenterologist:  Dr. Darrick PennaFields  Chief Complaint  Patient presents with  . Hepatitis C    HPI:   Trevor [name] is a 50 y.o. male who presents on referral from primary care for positive hepatitis C antibody. PCP notes reviewed. Per PCP notes, patient stated he was "treated for hepatitis C 3 years ago and a doctor said it might come back." Also stated he could not afford his blood pressure medication because of the 3 follow a co-pay but was able to afford it for pain medication. Labs included from PCP show hepatitis C virus antibody greater than 11, normal AST/ALT, normal bilirubin, alkaline phosphatase elevated at 146.  He is accompanied by his mother. Today he states he has been treated for Hep C before, he thinks by Dr. Teena [name], cannot remember which medication they used. This was paid for by Medicaid. Has had surgery since he was treated. Denies IV drug use or new tattoos since surgery. States he was told "it could come back within 5 years." Denies abdominal pain, N/V, hematochezia, melena, yellowing of skin/eyes, darkened urine, acute episodic confusion. Denies chest pain, dyspnea, dizziness, lightheadedness, syncope, near syncope. Denies any other upper or lower GI symptoms.   CT abdominal imaging on file 08/05/2014 which notes "liver...within normal."   Hepatitis C Risk Factors:  Birth cohort (1946 - 1966): No IV drug use: Denies any since last treatment Tattoos: Yes, none since last treatment Blood product transfusion: None since last treated [facility] worker: No Hemodialysis: No Maternal infection: No     Past Medical History:  Diagnosis Date  . Anxiety   . Arthritis   . COPD (chronic obstructive pulmonary disease) (HCC)   . Depression   . Diabetes mellitus   . Hepatitis C   . High cholesterol   . Hypertension   . Hypothyroidism   . MI, old   . Shortness of breath dyspnea     Past Surgical History:  Procedure Laterality  Date  . FINGER SURGERY    . HERNIA REPAIR    . KNEE SURGERY    . LAPAROTOMY N/A 08/05/2014   Procedure: EXPLORATORY LAPAROTOMY, SMALL BOWEL RESECTION;  Surgeon: Trevor RuddMatthew Tsuei, MD;  Location: [facility] OR;  Service: General;  Laterality: N/A;  . WRIST SURGERY      Current Outpatient Prescriptions  Medication Sig Dispense Refill  . acetaminophen (TYLENOL) 650 MG CR tablet Take 1,300 mg by mouth every 8 (eight) hours as needed for pain.    Marland Kitchen. atorvastatin (LIPITOR) 40 MG tablet Take 40 mg by mouth daily.    . cloNIDine (CATAPRES) 0.3 MG tablet Take 0.3 mg by mouth at bedtime.    . Cyanocobalamin (VITAMIN B 12 PO) Take 1 tablet by mouth daily.    Marland Kitchen. doxazosin (CARDURA) 2 MG tablet Take 2 mg by mouth daily.    Marland Kitchen. FLUoxetine (PROZAC) 20 MG capsule Take 20 mg by mouth every evening.    . gabapentin (NEURONTIN) 300 MG capsule Take 300 mg by mouth 3 (three) times daily.    Marland Kitchen. levothyroxine (SYNTHROID, LEVOTHROID) 25 MCG tablet Take 25 mcg by mouth daily before breakfast.    . lisinopril (PRINIVIL,ZESTRIL) 20 MG tablet Take 20 mg by mouth daily.    . metFORMIN (GLUCOPHAGE-XR) 500 MG 24 hr tablet Take 500 mg by mouth every evening.    . metoprolol succinate (TOPROL-XL) 25 MG 24 hr tablet Take 12.5 mg by mouth daily.    Marland Kitchen. oxyCODONE-acetaminophen (  PERCOCET/ROXICET) 5-325 MG tablet Take 1-2 tablets by mouth every 6 (six) hours as needed for moderate pain. 70 tablet 0  . tamsulosin (FLOMAX) 0.4 MG CAPS capsule Take 0.4 mg by mouth daily.    Marland Kitchen tiotropium (SPIRIVA) 18 MCG inhalation capsule Place 18 mcg into inhaler and inhale daily.     No current facility-administered medications for this visit.     Allergies as of 10/01/2015  . (No Known Allergies)    Family History  Problem Relation Age of Onset  . Alcohol abuse Father   . Arthritis Father   . Heart disease Father   . Hypertension Father   . Alcohol abuse Mother   . Arthritis Mother   . Diabetes Mother   . Heart disease Mother   . Hypertension  Mother   . Alcohol abuse Brother   . Hypertension Brother   . Colon cancer Neg Hx     Social History   Social History  . Marital status: Single    Spouse name: N/A  . Number of children: N/A  . Years of education: N/A   Occupational History  . Not on file.   Social History Main Topics  . Smoking status: Current Every Day Smoker    Packs/day: 0.50    Years: 30.00    Types: Cigarettes    Start date: 05/21/1982  . Smokeless tobacco: Never Used  . Alcohol use No     Comment: Quit drinking 3 years ago; Previously drank about a fifth of liquor per week.  . Drug use:     Frequency: 7.0 times per week    Types: Marijuana     Comment: "smokes several times a day for pain management."  . Sexual activity: Not on file   Other Topics Concern  . Not on file   Social History Narrative  . No narrative on file    Review of Systems: 10-point ROS negative except as per HPI.    Physical Exam: BP 114/71   Pulse (!) 58   Temp 97.8 F (36.6 C) (Oral)   Ht 5\' 8"  (1.727 m)   Wt 218 lb 12.8 oz (99.2 kg)   BMI 33.27 kg/m  General:   Alert and oriented. Pleasant and cooperative. Well-nourished and well-developed. Appears disheveled with poor hygiene.  Head:  Normocephalic and atraumatic. Eyes:  Without icterus, sclera clear and conjunctiva pink.  Ears:  Normal auditory acuity. Cardiovascular:  S1, S2 present without murmurs appreciated. Extremities without clubbing or edema. Respiratory:  Clear to auscultation bilaterally. No wheezes, rales, or rhonchi. No distress.  Gastrointestinal:  +BS, rounded but soft, non-tender and non-distended. No HSM noted. No guarding or rebound. No masses appreciated.  Rectal:  Deferred  Musculoskalatal:  Symmetrical without gross deformities. Skin:  Intact without significant lesions or rashes. Neurologic:  Alert and oriented x4;  grossly normal neurologically. Psych:  Alert and cooperative. Normal mood and affect. Heme/Lymph/Immune: No excessive  bruising noted.    10/01/2015 10:15 AM   Disclaimer: This note was dictated with voice recognition software. Similar sounding words can inadvertently be transcribed and may not be corrected upon review.

## 2015-10-01 NOTE — Progress Notes (Signed)
CC'ED TO PCP 

## 2015-10-01 NOTE — Patient Instructions (Signed)
1. Have your labs drawn when you're able to. 2. We will request your records from Dr. Teena DunkBenson. 3. We will refer you to the [facility] in [place]. 4. Return for follow-up based on their recommendation.

## 2015-10-01 NOTE — Assessment & Plan Note (Signed)
The patient was referred by primary care for positive hepatitis C. He states he was previously treated about 3 years ago by Dr. Teena DunkBenson and told "it could come back within 5 years." He denies IV drug use since treated. States he did have surgery twice since then and thinks it might of come from there. We will request records from Dr. Teena DunkBenson to establish what type of treatment the patient underwent and to document if there was sustained viral response at 12 months. Given that he is a treatment failure case we will refer [facility]. While we are waiting for his appointment we will check a CBC, CMP, hepatitis C RNA for confirmation and HCV genotype. He does have abdominal imaging within the past couple years that shows no cirrhosis. No indication for esophageal varices screening at this time. We'll attempt to scan in Dr. Starr LakeBenson's records as soon as we receive them so they are available for the liver clinic to review.

## 2015-10-03 ENCOUNTER — Other Ambulatory Visit (HOSPITAL_COMMUNITY): Payer: Self-pay | Admitting: Internal Medicine

## 2015-10-07 ENCOUNTER — Other Ambulatory Visit (HOSPITAL_COMMUNITY): Payer: Self-pay | Admitting: Internal Medicine

## 2015-10-07 DIAGNOSIS — M5440 Lumbago with sciatica, unspecified side: Secondary | ICD-10-CM

## 2015-10-07 DIAGNOSIS — J449 Chronic obstructive pulmonary disease, unspecified: Secondary | ICD-10-CM

## 2015-10-08 ENCOUNTER — Telehealth: Payer: Self-pay

## 2015-10-08 NOTE — Telephone Encounter (Signed)
Called pt and informed of appt with [facility] 10/22/15 at 2:15 pm.

## 2015-10-13 ENCOUNTER — Other Ambulatory Visit (HOSPITAL_COMMUNITY): Payer: Self-pay | Admitting: Internal Medicine

## 2015-10-13 DIAGNOSIS — J449 Chronic obstructive pulmonary disease, unspecified: Secondary | ICD-10-CM

## 2015-10-13 DIAGNOSIS — R062 Wheezing: Secondary | ICD-10-CM

## 2015-10-13 DIAGNOSIS — Z720 Tobacco use: Secondary | ICD-10-CM

## 2015-10-22 ENCOUNTER — Other Ambulatory Visit (HOSPITAL_COMMUNITY): Payer: Self-pay | Admitting: Nurse Practitioner

## 2015-10-22 DIAGNOSIS — B182 Chronic viral hepatitis C: Secondary | ICD-10-CM

## 2015-10-24 ENCOUNTER — Ambulatory Visit (HOSPITAL_COMMUNITY): Admission: RE | Admit: 2015-10-24 | Payer: Medicaid Other | Source: Ambulatory Visit

## 2015-10-24 ENCOUNTER — Ambulatory Visit (HOSPITAL_COMMUNITY)
Admission: RE | Admit: 2015-10-24 | Discharge: 2015-10-24 | Disposition: A | Discharge status: Home / Self Care | Payer: Medicaid Other | Source: Ambulatory Visit | Attending: Nurse Practitioner | Admitting: Nurse Practitioner

## 2015-10-24 ENCOUNTER — Encounter (HOSPITAL_COMMUNITY): Payer: Self-pay

## 2015-10-31 ENCOUNTER — Other Ambulatory Visit (HOSPITAL_COMMUNITY): Payer: Medicaid Other

## 2015-10-31 ENCOUNTER — Encounter (HOSPITAL_COMMUNITY): Payer: Self-pay

## 2015-10-31 ENCOUNTER — Ambulatory Visit (HOSPITAL_COMMUNITY)
Admission: RE | Admit: 2015-10-31 | Discharge: 2015-10-31 | Disposition: A | Discharge status: Home / Self Care | Payer: Medicaid Other | Source: Ambulatory Visit | Attending: Nurse Practitioner | Admitting: Nurse Practitioner

## 2015-10-31 ENCOUNTER — Ambulatory Visit (HOSPITAL_COMMUNITY)
Admission: RE | Admit: 2015-10-31 | Discharge: 2015-10-31 | Disposition: A | Discharge status: Home / Self Care | Payer: Medicaid Other | Source: Ambulatory Visit | Attending: Internal Medicine | Admitting: Internal Medicine

## 2015-10-31 DIAGNOSIS — J449 Chronic obstructive pulmonary disease, unspecified: Secondary | ICD-10-CM

## 2015-10-31 DIAGNOSIS — B182 Chronic viral hepatitis C: Secondary | ICD-10-CM | POA: Diagnosis present

## 2015-10-31 DIAGNOSIS — Z720 Tobacco use: Secondary | ICD-10-CM

## 2015-10-31 DIAGNOSIS — R062 Wheezing: Secondary | ICD-10-CM

## 2015-11-04 ENCOUNTER — Other Ambulatory Visit (HOSPITAL_COMMUNITY): Payer: Self-pay | Admitting: Nurse Practitioner

## 2015-11-04 DIAGNOSIS — R945 Abnormal results of liver function studies: Secondary | ICD-10-CM

## 2015-11-04 DIAGNOSIS — K7400 Hepatic fibrosis, unspecified: Secondary | ICD-10-CM

## 2015-11-04 DIAGNOSIS — R7989 Other specified abnormal findings of blood chemistry: Secondary | ICD-10-CM

## 2015-11-27 ENCOUNTER — Other Ambulatory Visit: Payer: Self-pay | Admitting: Radiology

## 2015-11-28 ENCOUNTER — Ambulatory Visit (HOSPITAL_COMMUNITY): Admission: RE | Admit: 2015-11-28 | Payer: Medicaid Other | Source: Ambulatory Visit

## 2015-12-15 ENCOUNTER — Other Ambulatory Visit: Payer: Self-pay | Admitting: Radiology

## 2015-12-18 ENCOUNTER — Ambulatory Visit (HOSPITAL_COMMUNITY): Payer: Medicaid Other

## 2015-12-23 ENCOUNTER — Other Ambulatory Visit: Payer: Self-pay | Admitting: Radiology

## 2015-12-24 ENCOUNTER — Ambulatory Visit (HOSPITAL_COMMUNITY)
Admission: RE | Admit: 2015-12-24 | Discharge: 2015-12-24 | Disposition: A | Discharge status: Home / Self Care | Payer: Medicaid Other | Source: Ambulatory Visit | Attending: Nurse Practitioner | Admitting: Nurse Practitioner

## 2015-12-24 DIAGNOSIS — R945 Abnormal results of liver function studies: Secondary | ICD-10-CM

## 2015-12-24 DIAGNOSIS — K7400 Hepatic fibrosis, unspecified: Secondary | ICD-10-CM

## 2015-12-24 DIAGNOSIS — R7989 Other specified abnormal findings of blood chemistry: Secondary | ICD-10-CM

## 2015-12-29 ENCOUNTER — Other Ambulatory Visit: Payer: Self-pay | Admitting: Radiology

## 2015-12-31 ENCOUNTER — Other Ambulatory Visit: Payer: Self-pay | Admitting: Radiology

## 2016-01-01 ENCOUNTER — Encounter (HOSPITAL_COMMUNITY): Payer: Self-pay

## 2016-01-01 ENCOUNTER — Encounter: Payer: Self-pay | Admitting: Radiology

## 2016-01-01 ENCOUNTER — Ambulatory Visit (HOSPITAL_COMMUNITY)
Admission: RE | Admit: 2016-01-01 | Discharge: 2016-01-01 | Disposition: A | Discharge status: Home / Self Care | Payer: Medicaid Other | Source: Ambulatory Visit | Attending: Nurse Practitioner | Admitting: Nurse Practitioner

## 2016-01-01 DIAGNOSIS — R0600 Dyspnea, unspecified: Secondary | ICD-10-CM | POA: Insufficient documentation

## 2016-01-01 DIAGNOSIS — E7800 Pure hypercholesterolemia, unspecified: Secondary | ICD-10-CM | POA: Insufficient documentation

## 2016-01-01 DIAGNOSIS — F419 Anxiety disorder, unspecified: Secondary | ICD-10-CM | POA: Diagnosis not present

## 2016-01-01 DIAGNOSIS — I10 Essential (primary) hypertension: Secondary | ICD-10-CM | POA: Insufficient documentation

## 2016-01-01 DIAGNOSIS — E039 Hypothyroidism, unspecified: Secondary | ICD-10-CM | POA: Insufficient documentation

## 2016-01-01 DIAGNOSIS — B1920 Unspecified viral hepatitis C without hepatic coma: Secondary | ICD-10-CM | POA: Insufficient documentation

## 2016-01-01 DIAGNOSIS — E119 Type 2 diabetes mellitus without complications: Secondary | ICD-10-CM | POA: Insufficient documentation

## 2016-01-01 DIAGNOSIS — Z01812 Encounter for preprocedural laboratory examination: Secondary | ICD-10-CM | POA: Diagnosis present

## 2016-01-01 DIAGNOSIS — F329 Major depressive disorder, single episode, unspecified: Secondary | ICD-10-CM | POA: Insufficient documentation

## 2016-01-01 DIAGNOSIS — J449 Chronic obstructive pulmonary disease, unspecified: Secondary | ICD-10-CM | POA: Insufficient documentation

## 2016-01-01 LAB — CBC
HEMATOCRIT: 44 % (ref 39.0–52.0)
Hemoglobin: 15.5 g/dL (ref 13.0–17.0)
MCH: 29.2 pg (ref 26.0–34.0)
MCHC: 35.2 g/dL (ref 30.0–36.0)
MCV: 83 fL (ref 78.0–100.0)
PLATELETS: 245 10*3/uL (ref 150–400)
RBC: 5.3 MIL/uL (ref 4.22–5.81)
RDW: 13 % (ref 11.5–15.5)
WBC: 8.6 10*3/uL (ref 4.0–10.5)

## 2016-01-01 LAB — APTT: APTT: 33 s (ref 24–36)

## 2016-01-01 LAB — PROTIME-INR
INR: 1.08
Prothrombin Time: 14.1 seconds (ref 11.4–15.2)

## 2016-01-01 MED ORDER — SODIUM CHLORIDE 0.9 % IV SOLN: INTRAVENOUS | Status: DC

## 2016-01-01 MED ORDER — NICOTINE 21 MG/24HR TD PT24
21.0000 mg | MEDICATED_PATCH | Freq: Every day | TRANSDERMAL | Status: DC
  Filled 2016-01-01: qty 1

## 2016-01-01 NOTE — H&P (Signed)
Chief Complaint: elevated liver function tests  Referring Physician: Annamarie Majorawn Drazek, NP  Supervising Physician: Irish LackYamagata, Glenn  [facility]  HPI: Trevor [name] Tandon is an 50 y.o. male with a history of hepatitis C who is followed by Annamarie Majorawn Drazek as he was a treatment failure case for his hep c.  He had liver function tests checked which have been elevated.  He denies abdominal pain.  A request has been made for a random liver biopsy to determine if he has fibrosis of his liver.   Past Medical History:  Past Medical History:  Diagnosis Date  . Anxiety   . Arthritis   . COPD (chronic obstructive pulmonary disease) (HCC)   . Depression   . Diabetes mellitus   . Hepatitis C   . High cholesterol   . Hypertension   . Hypothyroidism   . MI, old   . Shortness of breath dyspnea     Past Surgical History:  Past Surgical History:  Procedure Laterality Date  . FINGER SURGERY    . HERNIA REPAIR    . KNEE SURGERY    . LAPAROTOMY N/A 08/05/2014   Procedure: EXPLORATORY LAPAROTOMY, SMALL BOWEL RESECTION;  Surgeon: Manus RuddMatthew Tsuei, MD;  Location: [facility] OR;  Service: General;  Laterality: N/A;  . WRIST SURGERY      Family History:  Family History  Problem Relation Age of Onset  . Alcohol abuse Father   . Arthritis Father   . Heart disease Father   . Hypertension Father   . Alcohol abuse Mother   . Arthritis Mother   . Diabetes Mother   . Heart disease Mother   . Hypertension Mother   . Alcohol abuse Brother   . Hypertension Brother   . Colon cancer Neg Hx     Social History:  reports that he has been smoking Cigarettes.  He started smoking about 33 years ago. He has a 15.00 pack-year smoking history. He has never used smokeless tobacco. He reports that he uses drugs, including Marijuana, about 7 times per week. He reports that he does not drink alcohol.  Allergies:  Allergies  Allergen Reactions  . Ivp Dye [Iodinated Diagnostic Agents] Other (See Comments)   Kidney Failure - Per [facility]     Medications: Medications reviewed in Epic  Please HPI for pertinent positives, otherwise complete 10 system ROS negative.  Mallampati Score: MD Evaluation Airway: WNL Heart: WNL Abdomen: WNL Chest/ Lungs: WNL ASA  Classification: 3 Mallampati/Airway Score: Two  Physical Exam: BP (!) 183/114   Pulse 74   Temp 97.8 F (36.6 C)   Resp 20   Ht 5\' 8"  (1.727 m)   Wt 217 lb (98.4 kg)   SpO2 100%   BMI 32.99 kg/m  Body mass index is 32.99 kg/m. General:  WD, WN white male who is sitting on the edge of the bed anxious HEENT: head is normocephalic, atraumatic.  Sclera are noninjected.  PERRL.  Ears and nose without any masses or lesions.  Mouth is pink and dry Heart: regular, rate, and rhythm.  Normal s1,s2. No obvious murmurs, gallops, or rubs noted.  Palpable radial and pedal pulses bilaterally Lungs: CTAB, no wheezes, rhonchi, or rales noted.  Respiratory effort nonlabored Abd: soft, NT, ND, +BS, no masses, hernias, or organomegaly Psych: A&Ox3 with an appropriate affect.   Labs: Results for orders placed or performed during the hospital encounter of 01/01/16 (from the past 48 hour(s))  APTT upon arrival  Status: None   Collection Time: 01/01/16 10:16 AM  Result Value Ref Range   aPTT 33 24 - 36 seconds  CBC upon arrival     Status: None   Collection Time: 01/01/16 10:16 AM  Result Value Ref Range   WBC 8.6 4.0 - 10.5 K/uL   RBC 5.30 4.22 - 5.81 MIL/uL   Hemoglobin 15.5 13.0 - 17.0 g/dL   HCT 40.944.0 81.139.0 - 91.452.0 %   MCV 83.0 78.0 - 100.0 fL   MCH 29.2 26.0 - 34.0 pg   MCHC 35.2 30.0 - 36.0 g/dL   RDW 78.213.0 95.611.5 - 21.315.5 %   Platelets 245 150 - 400 K/uL  Protime-INR upon arrival     Status: None   Collection Time: 01/01/16 10:16 AM  Result Value Ref Range   Prothrombin Time 14.1 11.4 - 15.2 seconds   INR 1.08     Imaging: No results found.  Assessment/Plan 1. Elevated liver function tests, hx of Hepatitis C -patient upon arrival  was very upset and irate because he wanted to leave to go smoke.  He initially said he would not lay flat after the procedure and that he would just get up and leave.  I explained to him why we have people lay flat and stay for 3 hours (risk of bleeding).  He finally calmed down and stated that if we put a nicotine patch on him that he would be fine and willing to stay for the procedure and stay afterwards. -labs and vitals reviewed -Risks and Benefits discussed with the patient including, but not limited to bleeding, infection, damage to adjacent structures or low yield requiring additional tests. All of the patient's questions were answered, patient is agreeable to proceed. Consent signed and in chart.  Thank you for this interesting consult.  I greatly enjoyed meeting Trevor [name] Beckley and look forward to participating in their care.  A copy of this report was sent to the requesting provider on this date.  Electronically Signed: Letha CapeSBORNE,Waylen Depaolo E 01/01/2016, 12:27 PM   I spent a total of  40 Minutes  in face to face in clinical consultation, greater than 50% of which was counseling/coordinating care for elevated liver function tests

## 2016-01-01 NOTE — Progress Notes (Signed)
Pt was seen by several staff members/ security was called. Pt was dc after ready, pt needed a cigarette. Tresa EndoKelly PA saw pt but after 30 minutes he wanted to leave. IV removed and pt left, pt was loud and argumentative and unwilling to wait for a nicotine patch to arrive.

## 2016-01-01 NOTE — Progress Notes (Addendum)
   Pt was scheduled for 3rd time for random liver biopsy procedure.  Has rescheduled 2 other times because [facility]. Although appt is at 100 pm; he is informed prior to the procedure he must be here by 1130 for pre op labs and H/P for procedure. Has each time rescheduled willingly. And apologized for the misunderstanding of time requirements.  This day he has come in at 1130 am, appropriate time for procedure, but [facility]. "I thought procedure was 1130!" Was offered nicotine patch and would move forward with bx. Originally he agreed to procedure and to stay for it. Then left [facility] without procedure being done.  Scheduler will inform MD.

## 2016-01-02 ENCOUNTER — Encounter: Payer: Self-pay | Admitting: Radiology

## 2016-01-02 ENCOUNTER — Other Ambulatory Visit: Payer: Self-pay | Admitting: General Surgery

## 2016-01-02 NOTE — Progress Notes (Signed)
Patient ID: Trevor [name], male   DOB: 02/10/1965, 50 y.o.   MRN: [id number]  Cosigned by: Irish LackGlenn Yamagata, MD at 01/01/2016 3:39 PM  Attestation signed by Irish LackGlenn Yamagata, MD at 01/01/2016 3:39 PM  See details below.  Left AMA.  Appears unlikely he will ever be able to undergo liver biopsy unless he is admitted night before to hospital and agrees to stay.      [] Hide copied text [] Hover for attribution information   Pt was scheduled for 3rd time for random liver biopsy procedure.  Has rescheduled 2 other times [facility]. Although appt is at 100 pm; he is informed prior to the procedure he must be here by 1130 for pre op labs and H/P for procedure. Has each time rescheduled willingly. And apologized for the misunderstanding of time requirements.  This day he has come in at 1130 am, appropriate time for procedure, but [facility]. "I thought procedure was 1130!" Was offered nicotine patch and would move forward with bx. Originally he agreed to procedure and to stay for it. Then left [facility] without procedure being done.  Scheduler will inform MD.

## 2016-03-25 ENCOUNTER — Other Ambulatory Visit (HOSPITAL_COMMUNITY): Payer: Self-pay | Admitting: Psychiatry

## 2016-04-01 ENCOUNTER — Emergency Department (HOSPITAL_COMMUNITY)
Admission: EM | Admit: 2016-04-01 | Discharge: 2016-04-01 | Disposition: A | Discharge status: Home / Self Care | Payer: Medicaid Other | Attending: Emergency Medicine | Admitting: Emergency Medicine

## 2016-04-01 ENCOUNTER — Emergency Department (HOSPITAL_COMMUNITY)
Admission: EM | Admit: 2016-04-01 | Discharge: 2016-04-01 | Disposition: A | Discharge status: Home / Self Care | Payer: Medicaid Other | Source: Home / Self Care | Attending: Emergency Medicine | Admitting: Emergency Medicine

## 2016-04-01 ENCOUNTER — Encounter (HOSPITAL_COMMUNITY): Payer: Self-pay | Admitting: Emergency Medicine

## 2016-04-01 DIAGNOSIS — I10 Essential (primary) hypertension: Secondary | ICD-10-CM | POA: Diagnosis not present

## 2016-04-01 DIAGNOSIS — E039 Hypothyroidism, unspecified: Secondary | ICD-10-CM | POA: Diagnosis not present

## 2016-04-01 DIAGNOSIS — E119 Type 2 diabetes mellitus without complications: Secondary | ICD-10-CM | POA: Insufficient documentation

## 2016-04-01 DIAGNOSIS — M545 Low back pain: Principal | ICD-10-CM

## 2016-04-01 DIAGNOSIS — G8929 Other chronic pain: Secondary | ICD-10-CM

## 2016-04-01 DIAGNOSIS — Z7984 Long term (current) use of oral hypoglycemic drugs: Secondary | ICD-10-CM | POA: Diagnosis not present

## 2016-04-01 DIAGNOSIS — J449 Chronic obstructive pulmonary disease, unspecified: Secondary | ICD-10-CM | POA: Diagnosis not present

## 2016-04-01 DIAGNOSIS — Z79899 Other long term (current) drug therapy: Secondary | ICD-10-CM | POA: Diagnosis not present

## 2016-04-01 DIAGNOSIS — F17210 Nicotine dependence, cigarettes, uncomplicated: Secondary | ICD-10-CM | POA: Diagnosis not present

## 2016-04-01 DIAGNOSIS — I252 Old myocardial infarction: Secondary | ICD-10-CM | POA: Insufficient documentation

## 2016-04-01 MED ORDER — HYDROMORPHONE HCL 2 MG/ML IJ SOLN
2.0000 mg | Freq: Once | INTRAMUSCULAR | Status: AC
  Administered 2016-04-01: 2 mg via INTRAMUSCULAR
  Filled 2016-04-01: qty 1

## 2016-04-01 NOTE — ED Triage Notes (Signed)
Pt left to go to doctor's office to pick up prescription. Pt left before being triaged.

## 2016-04-01 NOTE — ED Provider Notes (Signed)
[facility]-EM[facility]Y DEPT Provider Note   CSN: [id number] Arrival date & time: 04/01/16  1508     History   Chief Complaint Chief Complaint  Patient presents with  . Back Pain    HPI Trevor [name] is a 51 y.o. male.  The history is provided by the patient. No language interpreter was used.  Back Pain   This is a new problem. The problem occurs constantly. The problem has been rapidly worsening. The pain is present in the lumbar spine. The pain does not radiate. The symptoms are aggravated by certain positions. The pain is the same all the time. He has tried nothing for the symptoms.  Pt is in pain management with Dr. Selena Batten.  Pt complains of pain not relieved by Percocet.   Past Medical History:  Diagnosis Date  . Anxiety   . Arthritis   . COPD (chronic obstructive pulmonary disease) (HCC)   . Depression   . Diabetes mellitus   . Hepatitis C   . High cholesterol   . Hypertension   . Hypothyroidism   . MI, old   . Shortness of breath dyspnea     Patient Active Problem List   Diagnosis Date Noted  . HCV antibody positive 10/01/2015  . Spondylolisthesis of lumbar region 03/05/2015  . Hypertension   . High cholesterol   . Fall 08/06/2014  . Small intestine injury 08/06/2014  . Closed fracture of transverse process of lumbar vertebra (HCC) 08/05/2014  . Syncope 07/18/2014    Past Surgical History:  Procedure Laterality Date  . FINGER SURGERY    . HERNIA REPAIR    . KNEE SURGERY    . LAPAROTOMY N/A 08/05/2014   Procedure: EXPLORATORY LAPAROTOMY, SMALL BOWEL RESECTION;  Surgeon: Manus Rudd, MD;  Location: [facility] OR;  Service: General;  Laterality: N/A;  . WRIST SURGERY         Home Medications    Prior to Admission medications   Medication Sig Start Date End Date Taking? Authorizing Provider  ALPRAZolam Prudy Feeler) 1 MG tablet Take 1 mg by mouth daily as needed for anxiety.    Historical Provider, MD  atorvastatin (LIPITOR) 40 MG tablet Take 40 mg by mouth daily.     Historical Provider, MD  cloNIDine (CATAPRES) 0.3 MG tablet Take 0.3 mg by mouth at bedtime.    Historical Provider, MD  gabapentin (NEURONTIN) 600 MG tablet Take 600 mg by mouth 4 (four) times daily.     Historical Provider, MD  ibuprofen (ADVIL,MOTRIN) 200 MG tablet Take 400 mg by mouth every 6 (six) hours as needed for headache or moderate pain.    Historical Provider, MD  levothyroxine (SYNTHROID, LEVOTHROID) 25 MCG tablet Take 25 mcg by mouth daily before breakfast.    Historical Provider, MD  lisinopril (PRINIVIL,ZESTRIL) 20 MG tablet Take 20 mg by mouth daily.    Historical Provider, MD  metFORMIN (GLUCOPHAGE-XR) 500 MG 24 hr tablet Take 500 mg by mouth every evening.    Historical Provider, MD  methylphenidate (RITALIN) 10 MG tablet Take 10 mg by mouth 3 (three) times daily with meals.    Historical Provider, MD  metoprolol succinate (TOPROL-XL) 25 MG 24 hr tablet Take 12.5 mg by mouth daily.    Historical Provider, MD  oxyCODONE-acetaminophen (PERCOCET/ROXICET) 5-325 MG tablet Take 1-2 tablets by mouth every 6 (six) hours as needed for moderate pain. 03/07/15   Coletta Memos, MD  tamsulosin (FLOMAX) 0.4 MG CAPS capsule Take 0.4 mg by mouth daily.  Historical Provider, MD  Tiotropium Bromide-Olodaterol (STIOLTO RESPIMAT) 2.5-2.5 MCG/ACT AERS Inhale 1 puff into the lungs 2 (two) times daily as needed (for shortness of breath).    Historical Provider, MD    Family History Family History  Problem Relation Age of Onset  . Alcohol abuse Father   . Arthritis Father   . Heart disease Father   . Hypertension Father   . Alcohol abuse Mother   . Arthritis Mother   . Diabetes Mother   . Heart disease Mother   . Hypertension Mother   . Alcohol abuse Brother   . Hypertension Brother   . Colon cancer Neg Hx     Social History Social History  Substance Use Topics  . Smoking status: Current Every Day Smoker    Packs/day: 0.50    Years: 30.00    Types: Cigarettes    Start date: 05/21/1982    . Smokeless tobacco: Never Used  . Alcohol use No     Comment: Quit drinking 3 years ago; Previously drank about a fifth of liquor per week.     Allergies   Ivp dye [iodinated diagnostic agents]   Review of Systems Review of Systems  Musculoskeletal: Positive for back pain.  All other systems reviewed and are negative.    Physical Exam Updated Vital Signs BP 112/60 (BP Location: Left Arm)   Pulse 64   Temp 98 F (36.7 C) (Oral)   Resp 18   Ht 5\' 7"  (1.702 m)   Wt 90.6 kg   SpO2 100%   BMI 31.29 kg/m   Physical Exam  Constitutional: He appears well-developed and well-nourished.  Cardiovascular: Normal rate and regular rhythm.   Pulmonary/Chest: Effort normal.  Abdominal: Soft.  Musculoskeletal: Normal range of motion.  Neurological: He is alert.  Skin: Skin is warm.  Psychiatric: He has a normal mood and affect.  Nursing note and vitals reviewed.    ED Treatments / Results  Labs (all labs ordered are listed, but only abnormal results are displayed) Labs Reviewed - No data to display  EKG  EKG Interpretation None       Radiology No results found.  Procedures Procedures (including critical care time)  Medications Ordered in ED Medications  HYDROmorphone (DILAUDID) injection 2 mg (not administered)     Initial Impression / Assessment and Plan / ED Course  I have reviewed the triage vital signs and the nursing notes.  Pertinent labs & imaging results that were available during my care of the patient were reviewed by me and considered in my medical decision making (see chart for details).    Pt advised to can give him a shot for pain.  Pain management needs to be discussed with his MD  Final Clinical Impressions(s) / ED Diagnoses   Final diagnoses:  Chronic low back pain, unspecified back pain laterality, with sciatica presence unspecified    New Prescriptions New Prescriptions   No medications on file  Pt advised to continue his current  medications.  Discuss pain management with his MD.    Elson AreasLeslie K Orlandis Sanden, PA-C 04/01/16 1629    Linwood DibblesJon Knapp, MD 04/01/16 2358

## 2016-04-01 NOTE — Discharge Instructions (Signed)
See your Physician to discuss pain medications

## 2016-04-01 NOTE — ED Triage Notes (Signed)
Pt returned from Drs office and received prescription for pain medication for 5 days.  Pt is scheduled to see Dr on Tuesday.  c/o pain to lower back (chronic), rating pain 10/10.

## 2016-05-31 ENCOUNTER — Other Ambulatory Visit (HOSPITAL_BASED_OUTPATIENT_CLINIC_OR_DEPARTMENT_OTHER): Payer: Self-pay

## 2016-05-31 DIAGNOSIS — G4733 Obstructive sleep apnea (adult) (pediatric): Secondary | ICD-10-CM

## 2016-06-10 ENCOUNTER — Ambulatory Visit: Payer: Medicaid Other | Attending: Neurology | Admitting: Neurology

## 2016-06-10 ENCOUNTER — Encounter (INDEPENDENT_AMBULATORY_CARE_PROVIDER_SITE_OTHER): Payer: Self-pay

## 2016-06-10 DIAGNOSIS — G4733 Obstructive sleep apnea (adult) (pediatric): Secondary | ICD-10-CM | POA: Insufficient documentation

## 2016-06-18 NOTE — Procedures (Signed)
[facility] Trevor Brau A. Gerilyn Pilgrim, MD     [email or web address]             NOCTURNAL POLYSOMNOGRAPHY   LOCATION: [facility]  Patient Name: Trevor [name], Trevor [name] Date: 06/10/2016 Gender: Male D.O.B: Dec 09, 1965 Age (years): 30 Referring Provider: Jorge Mandril NP Height (inches): 68 Interpreting Physician: Beryle Beams MD, ABSM Weight (lbs): 210 RPSGT: Peak, Jachin BMI: 32 MRN: [id number] Neck Size: 16.50 CLINICAL INFORMATION Sleep Study Type: NPSG  Indication for sleep study: N/A  Epworth Sleepiness Score: 0  SLEEP STUDY TECHNIQUE As per the AASM Manual for the Scoring of Sleep and Associated Events v2.3 (April 2016) with a hypopnea requiring 4% desaturations.  The channels recorded and monitored were frontal, central and occipital EEG, electrooculogram (EOG), submentalis EMG (chin), nasal and oral airflow, thoracic and abdominal wall motion, anterior tibialis EMG, snore microphone, electrocardiogram, and pulse oximetry.  MEDICATIONS Medications self-administered by patient taken the night of the study : N/A  Current Outpatient Prescriptions:  .  ALPRAZolam (XANAX) 1 MG tablet, Take 1 mg by mouth daily as needed for anxiety., Disp: , Rfl:  .  atorvastatin (LIPITOR) 40 MG tablet, Take 40 mg by mouth daily., Disp: , Rfl:  .  cloNIDine (CATAPRES) 0.3 MG tablet, Take 0.3 mg by mouth at bedtime., Disp: , Rfl:  .  gabapentin (NEURONTIN) 600 MG tablet, Take 600 mg by mouth 4 (four) times daily. , Disp: , Rfl:  .  ibuprofen (ADVIL,MOTRIN) 200 MG tablet, Take 400 mg by mouth every 6 (six) hours as needed for headache or moderate pain., Disp: , Rfl:  .  levothyroxine (SYNTHROID, LEVOTHROID) 25 MCG tablet, Take 25 mcg by mouth daily before breakfast., Disp: , Rfl:  .  lisinopril (PRINIVIL,ZESTRIL) 20 MG tablet, Take 20 mg by mouth daily., Disp: , Rfl:  .  metFORMIN (GLUCOPHAGE-XR) 500 MG 24 hr tablet, Take 500 mg by mouth every evening., Disp: , Rfl:  .  methylphenidate  (RITALIN) 10 MG tablet, Take 10 mg by mouth 3 (three) times daily with meals., Disp: , Rfl:  .  metoprolol succinate (TOPROL-XL) 25 MG 24 hr tablet, Take 12.5 mg by mouth daily., Disp: , Rfl:  .  oxyCODONE-acetaminophen (PERCOCET/ROXICET) 5-325 MG tablet, Take 1-2 tablets by mouth every 6 (six) hours as needed for moderate pain., Disp: 70 tablet, Rfl: 0 .  tamsulosin (FLOMAX) 0.4 MG CAPS capsule, Take 0.4 mg by mouth daily., Disp: , Rfl:  .  Tiotropium Bromide-Olodaterol (STIOLTO RESPIMAT) 2.5-2.5 MCG/ACT AERS, Inhale 1 puff into the lungs 2 (two) times daily as needed (for shortness of breath)., Disp: , Rfl:    SLEEP ARCHITECTURE The study was initiated at 9:50:20 PM and ended at 3:55:13 AM.  Sleep onset time was 65.7 minutes and the sleep efficiency was 58.5%. The total sleep time was 213.5 minutes.  Stage REM latency was 223.0 minutes.  The patient spent 13.82% of the night in stage N1 sleep, 73.77% in stage N2 sleep, 0.00% in stage N3 and 12.41% in REM.  Alpha intrusion was absent.  Supine sleep was 0.00%.  RESPIRATORY PARAMETERS The overall apnea/hypopnea index (AHI) was 16.9 per hour. There were 3 total apneas, including 3 obstructive, 0 central and 0 mixed apneas. There were 57 hypopneas and 2 RERAs.  The AHI during Stage REM sleep was 27.2 per hour.  AHI while supine was N/A per hour.  The mean oxygen saturation was 93.80%. The minimum SpO2 during sleep was 89.00%.  Loud snoring was noted during this study.  CARDIAC DATA  The 2 lead EKG demonstrated sinus rhythm. The mean heart rate was 64.21 beats per minute. Other EKG findings include: None. LEG MOVEMENT DATA The total PLMS were 0 with a resulting PLMS index of 0.00. Associated arousal with leg movement index was 0.0.  IMPRESSIONS - Moderate obstructive sleep apnea is noted. A trial of AutoPAP 8-14 is suggested. - Absent slow wave sleep is observed.    Argie RammingKofi A Leon Goodnow, MD Diplomate, American Board of Sleep  Medicine.  ELECTRONICALLY SIGNED ON:  06/18/2016, 10:39 AM [facility] PH: [phone]   FX: [phone] ACCREDITED BY THE AMERICAN ACADEMY OF SLEEP MEDICINE

## 2016-08-21 ENCOUNTER — Encounter (HOSPITAL_COMMUNITY): Payer: Self-pay | Admitting: *Deleted

## 2016-08-21 ENCOUNTER — Emergency Department (HOSPITAL_COMMUNITY): Payer: Medicaid Other

## 2016-08-21 ENCOUNTER — Observation Stay (HOSPITAL_COMMUNITY)
Admission: EM | Admit: 2016-08-21 | Discharge: 2016-08-22 | Disposition: A | Discharge status: Home / Self Care | Payer: Medicaid Other | Attending: Internal Medicine | Admitting: Internal Medicine

## 2016-08-21 DIAGNOSIS — Z7984 Long term (current) use of oral hypoglycemic drugs: Secondary | ICD-10-CM | POA: Insufficient documentation

## 2016-08-21 DIAGNOSIS — J181 Lobar pneumonia, unspecified organism: Secondary | ICD-10-CM | POA: Diagnosis not present

## 2016-08-21 DIAGNOSIS — M5442 Lumbago with sciatica, left side: Secondary | ICD-10-CM

## 2016-08-21 DIAGNOSIS — F17210 Nicotine dependence, cigarettes, uncomplicated: Secondary | ICD-10-CM | POA: Insufficient documentation

## 2016-08-21 DIAGNOSIS — J449 Chronic obstructive pulmonary disease, unspecified: Secondary | ICD-10-CM | POA: Diagnosis not present

## 2016-08-21 DIAGNOSIS — N179 Acute kidney failure, unspecified: Principal | ICD-10-CM | POA: Diagnosis present

## 2016-08-21 DIAGNOSIS — E039 Hypothyroidism, unspecified: Secondary | ICD-10-CM | POA: Diagnosis not present

## 2016-08-21 DIAGNOSIS — E119 Type 2 diabetes mellitus without complications: Secondary | ICD-10-CM | POA: Insufficient documentation

## 2016-08-21 DIAGNOSIS — R768 Other specified abnormal immunological findings in serum: Secondary | ICD-10-CM | POA: Diagnosis not present

## 2016-08-21 DIAGNOSIS — E876 Hypokalemia: Secondary | ICD-10-CM | POA: Diagnosis present

## 2016-08-21 DIAGNOSIS — Z720 Tobacco use: Secondary | ICD-10-CM | POA: Diagnosis present

## 2016-08-21 DIAGNOSIS — I10 Essential (primary) hypertension: Secondary | ICD-10-CM | POA: Diagnosis present

## 2016-08-21 DIAGNOSIS — M545 Low back pain: Secondary | ICD-10-CM | POA: Diagnosis present

## 2016-08-21 DIAGNOSIS — R072 Precordial pain: Secondary | ICD-10-CM | POA: Diagnosis not present

## 2016-08-21 DIAGNOSIS — R200 Anesthesia of skin: Secondary | ICD-10-CM | POA: Diagnosis not present

## 2016-08-21 DIAGNOSIS — M549 Dorsalgia, unspecified: Secondary | ICD-10-CM | POA: Diagnosis present

## 2016-08-21 DIAGNOSIS — Z79899 Other long term (current) drug therapy: Secondary | ICD-10-CM | POA: Insufficient documentation

## 2016-08-21 DIAGNOSIS — R7689 Other specified abnormal immunological findings in serum: Secondary | ICD-10-CM | POA: Diagnosis present

## 2016-08-21 DIAGNOSIS — J189 Pneumonia, unspecified organism: Secondary | ICD-10-CM | POA: Diagnosis present

## 2016-08-21 HISTORY — DX: Disorder of prostate, unspecified: N42.9

## 2016-08-21 LAB — CBC WITH DIFFERENTIAL/PLATELET
BASOS ABS: 0 10*3/uL (ref 0.0–0.1)
Basophils Relative: 0 %
EOS PCT: 1 %
Eosinophils Absolute: 0.1 10*3/uL (ref 0.0–0.7)
HEMATOCRIT: 37.7 % — AB (ref 39.0–52.0)
HEMOGLOBIN: 13.3 g/dL (ref 13.0–17.0)
LYMPHS PCT: 13 %
Lymphs Abs: 1.8 10*3/uL (ref 0.7–4.0)
MCH: 29.8 pg (ref 26.0–34.0)
MCHC: 35.3 g/dL (ref 30.0–36.0)
MCV: 84.5 fL (ref 78.0–100.0)
Monocytes Absolute: 1 10*3/uL (ref 0.1–1.0)
Monocytes Relative: 7 %
Neutro Abs: 11.1 10*3/uL — ABNORMAL HIGH (ref 1.7–7.7)
Neutrophils Relative %: 79 %
Platelets: 188 10*3/uL (ref 150–400)
RBC: 4.46 MIL/uL (ref 4.22–5.81)
RDW: 12.6 % (ref 11.5–15.5)
WBC: 14.1 10*3/uL — AB (ref 4.0–10.5)

## 2016-08-21 LAB — I-STAT CHEM 8, ED
BUN: 25 mg/dL — AB (ref 6–20)
CHLORIDE: 99 mmol/L — AB (ref 101–111)
Calcium, Ion: 1.11 mmol/L — ABNORMAL LOW (ref 1.15–1.40)
Creatinine, Ser: 1.6 mg/dL — ABNORMAL HIGH (ref 0.61–1.24)
Glucose, Bld: 168 mg/dL — ABNORMAL HIGH (ref 65–99)
HCT: 38 % — ABNORMAL LOW (ref 39.0–52.0)
Hemoglobin: 12.9 g/dL — ABNORMAL LOW (ref 13.0–17.0)
Potassium: 3.2 mmol/L — ABNORMAL LOW (ref 3.5–5.1)
Sodium: 137 mmol/L (ref 135–145)
TCO2: 27 mmol/L (ref 0–100)

## 2016-08-21 LAB — I-STAT TROPONIN, ED: TROPONIN I, POC: 0.02 ng/mL (ref 0.00–0.08)

## 2016-08-21 LAB — COMPREHENSIVE METABOLIC PANEL
ALK PHOS: 152 U/L — AB (ref 38–126)
ALT: 42 U/L (ref 17–63)
AST: 32 U/L (ref 15–41)
Albumin: 3.1 g/dL — ABNORMAL LOW (ref 3.5–5.0)
Anion gap: 10 (ref 5–15)
BUN: 24 mg/dL — AB (ref 6–20)
CALCIUM: 8.3 mg/dL — AB (ref 8.9–10.3)
CHLORIDE: 103 mmol/L (ref 101–111)
CO2: 25 mmol/L (ref 22–32)
CREATININE: 1.62 mg/dL — AB (ref 0.61–1.24)
GFR calc Af Amer: 55 mL/min — ABNORMAL LOW (ref >60)
GFR, EST NON AFRICAN AMERICAN: 47 mL/min — AB (ref >60)
Glucose, Bld: 135 mg/dL — ABNORMAL HIGH (ref 65–99)
Potassium: 2.9 mmol/L — ABNORMAL LOW (ref 3.5–5.1)
Sodium: 138 mmol/L (ref 135–145)
Total Bilirubin: 1 mg/dL (ref 0.3–1.2)
Total Protein: 6.8 g/dL (ref 6.5–8.1)

## 2016-08-21 LAB — URINALYSIS, ROUTINE W REFLEX MICROSCOPIC
BACTERIA UA: NONE SEEN
Bilirubin Urine: NEGATIVE
GLUCOSE, UA: 150 mg/dL — AB
Hgb urine dipstick: NEGATIVE
KETONES UR: NEGATIVE mg/dL
Nitrite: NEGATIVE
PROTEIN: 30 mg/dL — AB
SQUAMOUS EPITHELIAL / LPF: NONE SEEN
Specific Gravity, Urine: 1.014 (ref 1.005–1.030)
pH: 6 (ref 5.0–8.0)

## 2016-08-21 LAB — GLUCOSE, CAPILLARY: GLUCOSE-CAPILLARY: 158 mg/dL — AB (ref 65–99)

## 2016-08-21 MED ORDER — ENOXAPARIN SODIUM 40 MG/0.4ML ~~LOC~~ SOLN
40.0000 mg | SUBCUTANEOUS | Status: DC
Start: 2016-08-21 — End: 2016-08-23
  Administered 2016-08-21: 40 mg via SUBCUTANEOUS
  Filled 2016-08-21: qty 0.4

## 2016-08-21 MED ORDER — INSULIN ASPART 100 UNIT/ML ~~LOC~~ SOLN
0.0000 [IU] | Freq: Three times a day (TID) | SUBCUTANEOUS | Status: DC
  Administered 2016-08-22: 3 [IU] via SUBCUTANEOUS
  Administered 2016-08-22: 8 [IU] via SUBCUTANEOUS

## 2016-08-21 MED ORDER — POTASSIUM CHLORIDE CRYS ER 20 MEQ PO TBCR
40.0000 meq | EXTENDED_RELEASE_TABLET | Freq: Two times a day (BID) | ORAL | Status: DC
  Administered 2016-08-21 – 2016-08-22 (×2): 40 meq via ORAL
  Filled 2016-08-21 (×3): qty 2

## 2016-08-21 MED ORDER — DEXTROSE 5 % IV SOLN
1.0000 g | Freq: Once | INTRAVENOUS | Status: AC
  Administered 2016-08-21: 1 g via INTRAVENOUS
  Filled 2016-08-21: qty 10

## 2016-08-21 MED ORDER — SODIUM CHLORIDE 0.9 % IV BOLUS (SEPSIS)
500.0000 mL | Freq: Once | INTRAVENOUS | Status: AC
  Administered 2016-08-21: 500 mL via INTRAVENOUS

## 2016-08-21 MED ORDER — METOPROLOL TARTRATE 25 MG PO TABS
25.0000 mg | ORAL_TABLET | Freq: Once | ORAL | Status: AC
  Administered 2016-08-21: 25 mg via ORAL

## 2016-08-21 MED ORDER — GUAIFENESIN ER 600 MG PO TB12
600.0000 mg | ORAL_TABLET | Freq: Two times a day (BID) | ORAL | Status: DC
  Administered 2016-08-21 – 2016-08-22 (×2): 600 mg via ORAL
  Filled 2016-08-21 (×2): qty 1

## 2016-08-21 MED ORDER — NICOTINE 14 MG/24HR TD PT24: 14.0000 mg | MEDICATED_PATCH | TRANSDERMAL | Status: DC

## 2016-08-21 MED ORDER — AZITHROMYCIN 250 MG PO TABS
250.0000 mg | ORAL_TABLET | Freq: Every day | ORAL | Status: DC
  Administered 2016-08-22: 250 mg via ORAL
  Filled 2016-08-21: qty 1

## 2016-08-21 MED ORDER — CLONIDINE HCL 0.1 MG PO TABS
0.1000 mg | ORAL_TABLET | Freq: Three times a day (TID) | ORAL | Status: DC
  Administered 2016-08-21 – 2016-08-22 (×3): 0.1 mg via ORAL
  Filled 2016-08-21 (×3): qty 1

## 2016-08-21 MED ORDER — METOPROLOL TARTRATE 25 MG PO TABS
ORAL_TABLET | ORAL | Status: AC
  Filled 2016-08-21: qty 1

## 2016-08-21 MED ORDER — AZITHROMYCIN 250 MG PO TABS
500.0000 mg | ORAL_TABLET | Freq: Once | ORAL | Status: AC
  Administered 2016-08-21: 500 mg via ORAL
  Filled 2016-08-21: qty 2

## 2016-08-21 MED ORDER — OXYCODONE-ACETAMINOPHEN 7.5-325 MG PO TABS
1.0000 | ORAL_TABLET | Freq: Three times a day (TID) | ORAL | Status: DC | PRN
  Administered 2016-08-21 – 2016-08-22 (×3): 1 via ORAL
  Filled 2016-08-21 (×3): qty 1

## 2016-08-21 MED ORDER — SODIUM CHLORIDE 0.9 % IV SOLN: INTRAVENOUS | Status: DC

## 2016-08-21 MED ORDER — METHYLPHENIDATE HCL 5 MG PO TABS
10.0000 mg | ORAL_TABLET | Freq: Three times a day (TID) | ORAL | Status: DC
  Administered 2016-08-22 (×3): 10 mg via ORAL
  Filled 2016-08-21 (×3): qty 2

## 2016-08-21 MED ORDER — ATORVASTATIN CALCIUM 40 MG PO TABS
40.0000 mg | ORAL_TABLET | Freq: Every day | ORAL | Status: DC
  Administered 2016-08-22: 40 mg via ORAL
  Filled 2016-08-21: qty 1

## 2016-08-21 MED ORDER — METFORMIN HCL ER 500 MG PO TB24: 1000.0000 mg | ORAL_TABLET | Freq: Every day | ORAL | Status: DC

## 2016-08-21 MED ORDER — POTASSIUM CHLORIDE IN NACL 40-0.9 MEQ/L-% IV SOLN
INTRAVENOUS | Status: DC
  Administered 2016-08-21 – 2016-08-22 (×2): 125 mL/h via INTRAVENOUS

## 2016-08-21 MED ORDER — ACETAMINOPHEN 325 MG PO TABS
650.0000 mg | ORAL_TABLET | Freq: Four times a day (QID) | ORAL | Status: DC | PRN
  Administered 2016-08-22: 650 mg via ORAL
  Filled 2016-08-21: qty 2

## 2016-08-21 MED ORDER — ACETAMINOPHEN 650 MG RE SUPP: 650.0000 mg | Freq: Four times a day (QID) | RECTAL | Status: DC | PRN

## 2016-08-21 MED ORDER — KETOROLAC TROMETHAMINE 60 MG/2ML IM SOLN
60.0000 mg | Freq: Once | INTRAMUSCULAR | Status: AC
  Administered 2016-08-21: 60 mg via INTRAMUSCULAR
  Filled 2016-08-21: qty 2

## 2016-08-21 MED ORDER — METOPROLOL SUCCINATE ER 25 MG PO TB24
25.0000 mg | ORAL_TABLET | Freq: Every day | ORAL | Status: DC
  Administered 2016-08-22: 25 mg via ORAL
  Filled 2016-08-21 (×2): qty 1

## 2016-08-21 MED ORDER — NICOTINE 14 MG/24HR TD PT24
14.0000 mg | MEDICATED_PATCH | Freq: Once | TRANSDERMAL | Status: AC
  Administered 2016-08-21: 14 mg via TRANSDERMAL
  Filled 2016-08-21: qty 1

## 2016-08-21 MED ORDER — INSULIN ASPART 100 UNIT/ML ~~LOC~~ SOLN: 0.0000 [IU] | Freq: Every day | SUBCUTANEOUS | Status: DC

## 2016-08-21 NOTE — H&P (Signed)
History and Physical  Trevor MewRobert L Gatz ZOX:[id number] DOB: 06/17/1965 DOA: 08/21/2016  Referring physician: Dr Deretha EmoryZackowski, ED physician PCP: Patient, No Pcp Per  Outpatient Specialists: Dr Darrick PennaFields, Dr Wyline MoodBranch  Patient Coming From: home  Chief Complaint: back pain  HPI: Trevor MewRobert L [name] is a 51 y.o. male with a history of diabetes, hepatitis C, hypertension, COPD. Patient seen for back pain that started earlier today. Radiates down left leg. Usually improved with Toradol. No weakness, numbness. Pain has been progressive. He received a dose of Toradol in the emergency department which was helpful for his pain the patient states that his pain is currently a 2-3 out of 10.  In working up the patient for his back pain, blood work shows a elevated creatinine of 1.6 from a baseline of 0.9 and a chest x-ray that shows a possible early pneumonia. Patient was given azithromycin and ceftriaxone for this. Currently the patient has no symptoms of pneumonia: No cough, fevers, chills, nausea, vomiting, chest pain, pleurisy. Patient is inconsistent with taking his medications for his blood pressure, including his metoprolol and lisinopril. Additionally, he currently does not have a primary care physician to follow-up with.  Review of Systems:   Pt denies any fevers, chills, nausea, vomiting, diarrhea, constipation, abdominal pain, shortness of breath, dyspnea on exertion, orthopnea, cough, wheezing, palpitations, headache, vision changes, lightheadedness, dizziness, melena, rectal bleeding.  Review of systems are otherwise negative  Past Medical History:  Diagnosis Date  . Anxiety   . Arthritis   . COPD (chronic obstructive pulmonary disease) (HCC)   . Depression   . Diabetes mellitus   . Hepatitis C   . High cholesterol   . Hypertension   . Hypothyroidism   . MI, old   . Prostate disease   . Shortness of breath dyspnea    Past Surgical History:  Procedure Laterality Date  . FINGER SURGERY    .  HERNIA REPAIR    . KNEE SURGERY    . LAPAROTOMY N/A 08/05/2014   Procedure: EXPLORATORY LAPAROTOMY, SMALL BOWEL RESECTION;  Surgeon: Manus RuddMatthew Tsuei, MD;  Location: [facility] OR;  Service: General;  Laterality: N/A;  . WRIST SURGERY     Social History:  reports that he has been smoking Cigarettes.  He started smoking about 34 years ago. He has a 15.00 pack-year smoking history. He has never used smokeless tobacco. He reports that he does not drink alcohol or use drugs. Patient lives at Home  Allergies  Allergen Reactions  . Ivp Dye [Iodinated Diagnostic Agents] Other (See Comments)    Kidney Failure - Per [facility]     Family History  Problem Relation Age of Onset  . Alcohol abuse Father   . Arthritis Father   . Heart disease Father   . Hypertension Father   . Alcohol abuse Mother   . Arthritis Mother   . Diabetes Mother   . Heart disease Mother   . Hypertension Mother   . Alcohol abuse Brother   . Hypertension Brother   . Colon cancer Neg Hx      Prior to Admission medications   Medication Sig Start Date End Date Taking? Authorizing Provider  cloNIDine (CATAPRES) 0.1 MG tablet Take 0.1 mg by mouth 3 (three) times daily.    Yes [provider]  Cyanocobalamin (B-12 PO) Take 1 tablet by mouth daily.   Yes [provider]  gabapentin (NEURONTIN) 800 MG tablet Take 800 mg by mouth 2 (two) times daily.    Yes [provider]  lisinopril (PRINIVIL,ZESTRIL) 20 MG tablet Take 20 mg by mouth daily.   Yes [provider]  metFORMIN (GLUCOPHAGE-XR) 500 MG 24 hr tablet Take 500 mg by mouth every evening.   Yes [provider]  methylphenidate (RITALIN) 10 MG tablet Take 10 mg by mouth 3 (three) times daily with meals.   Yes [provider]  oxyCODONE-acetaminophen (PERCOCET) 7.5-325 MG tablet Take 1 tablet by mouth 3 (three) times daily as needed for moderate pain or severe pain.   Yes [provider]  tamsulosin (FLOMAX) 0.4 MG CAPS  capsule Take 0.4 mg by mouth daily.   Yes [provider]  atorvastatin (LIPITOR) 40 MG tablet Take 40 mg by mouth daily.    [provider]  metoprolol succinate (TOPROL-XL) 25 MG 24 hr tablet Take 12.5 mg by mouth daily.    [provider]  Tiotropium Bromide-Olodaterol (STIOLTO RESPIMAT) 2.5-2.5 MCG/ACT AERS Inhale 1 puff into the lungs 2 (two) times daily as needed (for shortness of breath).    [provider]    Physical Exam: BP (!) 174/78   Pulse 64   Temp 98 F (36.7 C)   Resp (!) 22   Ht 5\' 8"  (1.727 m)   Wt 98 kg (216 lb)   SpO2 96%   BMI 32.84 kg/m   General: Middle-aged male. Awake and alert and oriented x3. No acute cardiopulmonary distress.  HEENT: Normocephalic atraumatic.  Right and left ears normal in appearance.  Pupils equal, round, reactive to light. Extraocular muscles are intact. Sclerae anicteric and noninjected.  Moist mucosal membranes. No mucosal lesions.  Neck: Neck supple without lymphadenopathy. No carotid bruits. No masses palpated.  Cardiovascular: Regular rate with normal S1-S2 sounds. No murmurs, rubs, gallops auscultated. No JVD.  Respiratory: Possibly rales in the right base. No rhonchi. Good air movement. Abdomen: Soft, nontender, nondistended. Active bowel sounds. No masses or hepatosplenomegaly  Skin: No rashes, lesions, or ulcerations.  Dry, warm to touch. 2+ dorsalis pedis and radial pulses. Musculoskeletal: No calf or leg pain. All major joints not erythematous nontender.  No upper or lower joint deformation.  Good ROM.  No contractures  Psychiatric: Intact judgment and insight. Pleasant and cooperative. Neurologic: No focal neurological deficits. Strength is 5/5 and symmetric in upper and lower extremities.  Cranial nerves II through XII are grossly intact.           Labs on Admission: I have personally reviewed following labs and imaging studies  CBC:  Recent Labs Lab 08/21/16 2009 08/21/16 2146    WBC  --  14.1*  NEUTROABS  --  11.1*  HGB 12.9* 13.3  HCT 38.0* 37.7*  MCV  --  84.5  PLT  --  188   Basic Metabolic Panel:  Recent Labs Lab 08/21/16 2009  NA 137  K 3.2*  CL 99*  GLUCOSE 168*  BUN 25*  CREATININE 1.60*   GFR: Estimated Creatinine Clearance: 61.3 mL/min (A) (by C-G formula based on SCr of 1.6 mg/dL (H)). Liver Function Tests: No results for input(s): AST, ALT, ALKPHOS, BILITOT, PROT, ALBUMIN in the last 168 hours. No results for input(s): LIPASE, AMYLASE in the last 168 hours. No results for input(s): AMMONIA in the last 168 hours. Coagulation Profile: No results for input(s): INR, PROTIME in the last 168 hours. Cardiac Enzymes: No results for input(s): CKTOTAL, CKMB, CKMBINDEX, TROPONINI in the last 168 hours. BNP (last 3 results) No results for input(s): PROBNP in the last 8760 hours.  HbA1C: No results for input(s): HGBA1C in the last 72 hours. CBG: No results for input(s): GLUCAP in the last 168 hours. Lipid Profile: No results for input(s): CHOL, HDL, LDLCALC, TRIG, CHOLHDL, LDLDIRECT in the last 72 hours. Thyroid Function Tests: No results for input(s): TSH, T4TOTAL, FREET4, T3FREE, THYROIDAB in the last 72 hours. Anemia Panel: No results for input(s): VITAMINB12, FOLATE, FERRITIN, TIBC, IRON, RETICCTPCT in the last 72 hours. Urine analysis:    Component Value Date/Time   COLORURINE YELLOW 08/21/2016 1924   APPEARANCEUR CLEAR 08/21/2016 1924   LABSPEC 1.014 08/21/2016 1924   PHURINE 6.0 08/21/2016 1924   GLUCOSEU 150 (A) 08/21/2016 1924   HGBUR NEGATIVE 08/21/2016 1924   BILIRUBINUR NEGATIVE 08/21/2016 1924   KETONESUR NEGATIVE 08/21/2016 1924   PROTEINUR 30 (A) 08/21/2016 1924   UROBILINOGEN 0.2 06/19/2014 1648   NITRITE NEGATIVE 08/21/2016 1924   LEUKOCYTESUR TRACE (A) 08/21/2016 1924   Sepsis Labs: @LABRCNTIP (procalcitonin:4,lacticidven:4) )No results found for this or any previous visit (from the past 240 hour(s)).    Radiological Exams on Admission: Dg Chest 2 View  Result Date: 08/21/2016 CLINICAL DATA:  Chest pain, back pain. EXAM: CHEST  2 VIEW COMPARISON:  Chest x-ray dated 10/02/2014. FINDINGS: New opacity at the right lung base, only well seen on the AP view, suspicious for developing pneumonia. Lungs otherwise clear. No pleural effusion or pneumothorax seen. Heart size and mediastinal contours are stable. Mild degenerative spurring within the thoracic spine. No acute or suspicious osseous finding. IMPRESSION: New hazy opacity at the right lung base, suspicious for early developing pneumonia. Recommend follow-up chest x-ray to ensure clearing. Electronically Signed   By: Bary RichardStan  Maynard M.D.   On: 08/21/2016 20:26    EKG: Independently reviewed. Sinus rhythm with LVH. No acute ST changes.  Assessment/Plan: Principal Problem:   Acute renal injury (HCC) Active Problems:   Hypertension   HCV antibody positive   Community acquired pneumonia   Tobacco abuse   Back pain    This patient was discussed with the ED physician, including pertinent vitals, physical exam findings, labs, and imaging.  We also discussed care given by the ED provider.  #1 acute renal injury  Observation due to social situation and patient not having a primary care physician to follow-up with to ensure improvement of his acute renal injury  This is likely prerenal.  Additional 500 mL of fluid given. Will start maintenance fluids at 100 ML's per hour  BMP tomorrow #2 community-acquired pneumonia  White count elevated to 14.  Continue azithromycin.  Cultures not obtained as patient relatively asymptomatic and this can be treated as an outpatient. #3 hypertension  Stop lisinopril  Increase metoprolol. Continue clonidine #4 hep C antibody positive  LFTs pending #5 tobacco abuse  Neck and patch #6 back pain  Improved.  DVT prophylaxis: Lovenox Consultants: None Code Status: Full code Family Communication:  None  Disposition Plan: Patient should be able to return home following admission   Noralee CharsStinson, Laneya Gasaway J, DO [facility] Pager [phone]  If 7PM-7AM, please contact night-coverage [email or web address] Password [email or web address]

## 2016-08-21 NOTE — ED Provider Notes (Signed)
[facility]-EMERGENCY DEPT Provider Note   CSN: [id number] Arrival date & time: 08/21/16  1859     History   Chief Complaint Chief Complaint  Patient presents with  . Back Pain    HPI Roxanna MewRobert L Apolinar is a 51 y.o. male.  Patient presenting with a several day complaint of left anterior chest pain. Impression for a week. Also left-sided low back pain. It radiates into the left leg. Patient with long-standing left shoulder cyst at soft tissue cyst. That is causing no current problem. Patient states he is usually improved with Toradol for the back pain. No fall or injuries. The chest pain has been constant for several days patient estimates it to be 1 week.      Past Medical History:  Diagnosis Date  . Anxiety   . Arthritis   . COPD (chronic obstructive pulmonary disease) (HCC)   . Depression   . Diabetes mellitus   . Hepatitis C   . High cholesterol   . Hypertension   . Hypothyroidism   . MI, old   . Prostate disease   . Shortness of breath dyspnea     Patient Active Problem List   Diagnosis Date Noted  . HCV antibody positive 10/01/2015  . Spondylolisthesis of lumbar region 03/05/2015  . Hypertension   . High cholesterol   . Fall 08/06/2014  . Small intestine injury 08/06/2014  . Closed fracture of transverse process of lumbar vertebra (HCC) 08/05/2014  . Syncope 07/18/2014    Past Surgical History:  Procedure Laterality Date  . FINGER SURGERY    . HERNIA REPAIR    . KNEE SURGERY    . LAPAROTOMY N/A 08/05/2014   Procedure: EXPLORATORY LAPAROTOMY, SMALL BOWEL RESECTION;  Surgeon: Manus RuddMatthew Tsuei, MD;  Location: [facility] OR;  Service: General;  Laterality: N/A;  . WRIST SURGERY         Home Medications    Prior to Admission medications   Medication Sig Start Date End Date Taking? Authorizing Provider  cloNIDine (CATAPRES) 0.1 MG tablet Take 0.1 mg by mouth 3 (three) times daily.    Yes [provider]  Cyanocobalamin (B-12 PO) Take 1 tablet by mouth daily.    Yes [provider]  gabapentin (NEURONTIN) 800 MG tablet Take 800 mg by mouth 2 (two) times daily.    Yes [provider]  lisinopril (PRINIVIL,ZESTRIL) 20 MG tablet Take 20 mg by mouth daily.   Yes [provider]  metFORMIN (GLUCOPHAGE-XR) 500 MG 24 hr tablet Take 500 mg by mouth every evening.   Yes [provider]  methylphenidate (RITALIN) 10 MG tablet Take 10 mg by mouth 3 (three) times daily with meals.   Yes [provider]  oxyCODONE-acetaminophen (PERCOCET) 7.5-325 MG tablet Take 1 tablet by mouth 3 (three) times daily as needed for moderate pain or severe pain.   Yes [provider]  tamsulosin (FLOMAX) 0.4 MG CAPS capsule Take 0.4 mg by mouth daily.   Yes [provider]  atorvastatin (LIPITOR) 40 MG tablet Take 40 mg by mouth daily.    [provider]  metoprolol succinate (TOPROL-XL) 25 MG 24 hr tablet Take 12.5 mg by mouth daily.    [provider]  Tiotropium Bromide-Olodaterol (STIOLTO RESPIMAT) 2.5-2.5 MCG/ACT AERS Inhale 1 puff into the lungs 2 (two) times daily as needed (for shortness of breath).    [provider]    Family History Family History  Problem Relation Age of Onset  . Alcohol abuse Father   .  Arthritis Father   . Heart disease Father   . Hypertension Father   . Alcohol abuse Mother   . Arthritis Mother   . Diabetes Mother   . Heart disease Mother   . Hypertension Mother   . Alcohol abuse Brother   . Hypertension Brother   . Colon cancer Neg Hx     Social History Social History  Substance Use Topics  . Smoking status: Current Every Day Smoker    Packs/day: 0.50    Years: 30.00    Types: Cigarettes    Start date: 05/21/1982  . Smokeless tobacco: Never Used  . Alcohol use No     Comment: Quit drinking 3 years ago; Previously drank about a fifth of liquor per week.     Allergies   Ivp dye [iodinated diagnostic agents]   Review of Systems Review of  Systems  Constitutional: Negative for fever.  HENT: Negative for congestion.   Eyes: Negative for visual disturbance.  Respiratory: Negative for shortness of breath.   Cardiovascular: Positive for chest pain.  Gastrointestinal: Negative for abdominal pain.  Genitourinary: Negative for dysuria.  Musculoskeletal: Positive for back pain.  Skin: Negative for rash.  Neurological: Positive for numbness. Negative for headaches.  Hematological: Does not bruise/bleed easily.  Psychiatric/Behavioral: Negative for confusion.     Physical Exam Updated Vital Signs BP (!) 174/78   Pulse 64   Temp 98 F (36.7 C)   Resp (!) 22   Ht 1.727 m (5\' 8" )   Wt 98 kg (216 lb)   SpO2 96%   BMI 32.84 kg/m   Physical Exam  Constitutional: He is oriented to person, place, and time. He appears well-developed and well-nourished. No distress.  HENT:  Head: Normocephalic and atraumatic.  Mucous membranes dry  Eyes: Pupils are equal, round, and reactive to light. EOM are normal.  Neck: Normal range of motion. Neck supple.  Cardiovascular: Normal rate, regular rhythm and normal heart sounds.   Pulmonary/Chest: Effort normal and breath sounds normal.  Abdominal: Soft. Bowel sounds are normal. There is no tenderness.  Musculoskeletal: Normal range of motion.  Tenderness to palpation right lower lumbar area.  Neurological: He is alert and oriented to person, place, and time. No cranial nerve deficit or sensory deficit. He exhibits normal muscle tone. Coordination normal.  Skin: Skin is warm.  Nursing note and vitals reviewed.    ED Treatments / Results  Labs (all labs ordered are listed, but only abnormal results are displayed) Labs Reviewed  URINALYSIS, ROUTINE W REFLEX MICROSCOPIC - Abnormal; Notable for the following:       Result Value   Glucose, UA 150 (*)    Protein, ur 30 (*)    Leukocytes, UA TRACE (*)    All other components within normal limits  I-STAT CHEM 8, ED - Abnormal; Notable for  the following:    Potassium 3.2 (*)    Chloride 99 (*)    BUN 25 (*)    Creatinine, Ser 1.60 (*)    Glucose, Bld 168 (*)    Calcium, Ion 1.11 (*)    Hemoglobin 12.9 (*)    HCT 38.0 (*)    All other components within normal limits  CBC WITH DIFFERENTIAL/PLATELET  COMPREHENSIVE METABOLIC PANEL  I-STAT TROPONIN, ED    EKG  EKG Interpretation  Date/Time:  Saturday August 21 2016 19:22:47 EDT Ventricular Rate:  64 PR Interval:    QRS Duration: 108 QT Interval:  434 QTC Calculation: 448 R Axis:  46 Text Interpretation:  Sinus rhythm Borderline short PR interval Left ventricular hypertrophy Anterior Q waves, possibly due to LVH Confirmed by Vanetta Mulders [phone]) on 08/21/2016 7:27:14 PM       Radiology Dg Chest 2 View  Result Date: 08/21/2016 CLINICAL DATA:  Chest pain, back pain. EXAM: CHEST  2 VIEW COMPARISON:  Chest x-ray dated 10/02/2014. FINDINGS: New opacity at the right lung base, only well seen on the AP view, suspicious for developing pneumonia. Lungs otherwise clear. No pleural effusion or pneumothorax seen. Heart size and mediastinal contours are stable. Mild degenerative spurring within the thoracic spine. No acute or suspicious osseous finding. IMPRESSION: New hazy opacity at the right lung base, suspicious for early developing pneumonia. Recommend follow-up chest x-ray to ensure clearing. Electronically Signed   By: Bary Richard M.D.   On: 08/21/2016 20:26    Procedures Procedures (including critical care time)  Medications Ordered in ED Medications  nicotine (NICODERM CQ - dosed in mg/24 hours) patch 14 mg (14 mg Transdermal Patch Applied 08/21/16 2016)  0.9 %  sodium chloride infusion (not administered)  cefTRIAXone (ROCEPHIN) 1 g in dextrose 5 % 50 mL IVPB (1 g Intravenous New Bag/Given 08/21/16 2134)  ketorolac (TORADOL) injection 60 mg (60 mg Intramuscular Given 08/21/16 1955)  sodium chloride 0.9 % bolus 500 mL (500 mLs Intravenous New Bag/Given 08/21/16 2133)    azithromycin (ZITHROMAX) tablet 500 mg (500 mg Oral Given 08/21/16 2133)     Initial Impression / Assessment and Plan / ED Course  I have reviewed the triage vital signs and the nursing notes.  Pertinent labs & imaging results that were available during my care of the patient were reviewed by me and considered in my medical decision making (see chart for details).     Patient's labs here showing significant elevation in BUN and creatinine consistent with acute renal failure. Discussed with hospitalist I felt that he needed to be admitted for this and fluids hospitalist does not want admit based off of the i-STAT Chem-8. Have ordered basic complete metabolic panel as part of the admission stuff will wait for that to come back. Patient also with questionable right lower lobe pneumonia does not require admission for that will receive IV Rocephin and Zithromax. I felt that the creatinine was so markedly elevated along with the BUN that it was unlikely that were going to get a significantly normal BUN and creatinine. But they're concerned about the i-STAT not being accurate. Patient's blood sugar on the i-STAT is similar to previous labs. We'll recheck a formal labs and then now we'll reevaluate need for admission.  Hospitalist came in and reevaluation though go ahead and admit him just hydrate overnight and see if the renal function improves. We'll not wait for formal labs. Also 1 to make very clear that patient's Toradol was ordered prior to knowing the current renal function. His past renal function was checked in and creatinines have been reasonable in the past. Based on to current creatinine patient should not have received Toradol.  Patient stated that Toradol is what helps him that's why it was given to him. He did not want narcotics for the back pain.  Final Clinical Impressions(s) / ED Diagnoses   Final diagnoses:  Community acquired pneumonia of right lower lobe of lung (HCC)  Acute renal  failure, unspecified acute renal failure type (HCC)  Precordial pain    New Prescriptions New Prescriptions   No medications on file     Vanetta Mulders,  MD 08/21/16 2200

## 2016-08-21 NOTE — ED Triage Notes (Signed)
Pt presents to er with initial complaint of left lower back pain that radiates down left leg that started a few days ago, denies any injury, states " I have taken a whole bottle of percocet's for the pain" pt admits to taking between 50-60 7.5mg  percocet's in the past week, pt came into triage room arguing with staff about people being called back in front of him, RN explained to pt the process of triage, pt then stated " I have several problems and my chest hurts too" c/o chest pain located on left side that is non radiating and intermittent for the past few days,

## 2016-08-22 DIAGNOSIS — N179 Acute kidney failure, unspecified: Principal | ICD-10-CM

## 2016-08-22 DIAGNOSIS — J189 Pneumonia, unspecified organism: Secondary | ICD-10-CM

## 2016-08-22 LAB — GLUCOSE, CAPILLARY
GLUCOSE-CAPILLARY: 115 mg/dL — AB (ref 65–99)
GLUCOSE-CAPILLARY: 272 mg/dL — AB (ref 65–99)
Glucose-Capillary: 171 mg/dL — ABNORMAL HIGH (ref 65–99)
Glucose-Capillary: 267 mg/dL — ABNORMAL HIGH (ref 65–99)

## 2016-08-22 LAB — BASIC METABOLIC PANEL
Anion gap: 9 (ref 5–15)
BUN: 26 mg/dL — AB (ref 6–20)
CALCIUM: 8.1 mg/dL — AB (ref 8.9–10.3)
CO2: 26 mmol/L (ref 22–32)
CREATININE: 1.69 mg/dL — AB (ref 0.61–1.24)
Chloride: 104 mmol/L (ref 101–111)
GFR calc Af Amer: 52 mL/min — ABNORMAL LOW (ref >60)
GFR, EST NON AFRICAN AMERICAN: 45 mL/min — AB (ref >60)
Glucose, Bld: 123 mg/dL — ABNORMAL HIGH (ref 65–99)
POTASSIUM: 3.5 mmol/L (ref 3.5–5.1)
SODIUM: 139 mmol/L (ref 135–145)

## 2016-08-22 LAB — CBC
HEMATOCRIT: 42.1 % (ref 39.0–52.0)
Hemoglobin: 14.3 g/dL (ref 13.0–17.0)
MCH: 29.2 pg (ref 26.0–34.0)
MCHC: 34 g/dL (ref 30.0–36.0)
MCV: 85.9 fL (ref 78.0–100.0)
Platelets: 224 10*3/uL (ref 150–400)
RBC: 4.9 MIL/uL (ref 4.22–5.81)
RDW: 12.9 % (ref 11.5–15.5)
WBC: 13.1 10*3/uL — AB (ref 4.0–10.5)

## 2016-08-22 MED ORDER — MORPHINE SULFATE (PF) 4 MG/ML IV SOLN
4.0000 mg | Freq: Once | INTRAVENOUS | Status: AC
  Administered 2016-08-22: 4 mg via INTRAVENOUS
  Filled 2016-08-22: qty 1

## 2016-08-22 MED ORDER — MORPHINE SULFATE (PF) 2 MG/ML IV SOLN
2.0000 mg | INTRAVENOUS | Status: DC | PRN
  Administered 2016-08-22: 2 mg via INTRAVENOUS
  Filled 2016-08-22: qty 1

## 2016-08-22 MED ORDER — HYDRALAZINE HCL 20 MG/ML IJ SOLN
10.0000 mg | INTRAMUSCULAR | Status: DC | PRN
  Administered 2016-08-22: 10 mg via INTRAVENOUS
  Filled 2016-08-22: qty 1

## 2016-08-22 MED ORDER — METHYLPREDNISOLONE SODIUM SUCC 40 MG IJ SOLR
40.0000 mg | Freq: Four times a day (QID) | INTRAMUSCULAR | Status: DC
  Administered 2016-08-22 (×3): 40 mg via INTRAVENOUS
  Filled 2016-08-22 (×3): qty 1

## 2016-08-22 MED ORDER — IPRATROPIUM-ALBUTEROL 0.5-2.5 (3) MG/3ML IN SOLN
3.0000 mL | RESPIRATORY_TRACT | Status: DC
  Administered 2016-08-22 (×3): 3 mL via RESPIRATORY_TRACT
  Filled 2016-08-22 (×3): qty 3

## 2016-08-22 MED ORDER — ALBUTEROL SULFATE (2.5 MG/3ML) 0.083% IN NEBU
2.5000 mg | INHALATION_SOLUTION | RESPIRATORY_TRACT | Status: DC | PRN
  Administered 2016-08-22: 2.5 mg via RESPIRATORY_TRACT
  Filled 2016-08-22: qty 3

## 2016-08-22 MED ORDER — IPRATROPIUM-ALBUTEROL 0.5-2.5 (3) MG/3ML IN SOLN
3.0000 mL | Freq: Three times a day (TID) | RESPIRATORY_TRACT | Status: DC
  Administered 2016-08-22: 3 mL via RESPIRATORY_TRACT
  Filled 2016-08-22: qty 3

## 2016-08-22 NOTE — Progress Notes (Signed)
PROGRESS NOTE    Trevor MewRobert L [name]  ZOX:[id number] DOB: 08/13/1965 DOA: 08/21/2016 PCP: Patient, No Pcp Per    Brief Narrative: 51 yo with hx DM, hepC, HTN, COPD, presented with acute on chronic low back pain, admitted for COPD exacerbation and ? Early PNA, started on IV Rocephin and Zithromax, along with having low K and AKI.  He does have significant wheezing, and was found in pain and hypertensive.    Assessment & Plan:   Principal Problem:   Acute renal injury (HCC) Active Problems:   Hypertension   HCV antibody positive   Community acquired pneumonia   Tobacco abuse   Back pain   Hypokalemia  1. COPD Exacerbation with possible PNA:  Continue with IV antibiotics.  Increase frequency of nebs, and give IV steroids.  Use mask if he mouth breathing too fast.  2. AKI: Avoid nephrotoxic drug.  He had some IVF.  Will d/c it now.  Recheck renal fx test in am. 3. HTN:  Significant this morning.  Continue with clonidine.  Give Morphine for pain. IV Hydralazine for severe HTN. 4. Tobacco abuse:  Advised stop. 5. Hypokalemia:  Supplemented.   DVT prophylaxis: SubQ hep. Code Status:FULL CODE.  Family Communication: None.  Disposition Plan: home.   Consultants:   None.   Procedures:   None.   Antimicrobials: Anti-infectives    Start     Dose/Rate Route Frequency Ordered Stop   08/22/16 1000  azithromycin (ZITHROMAX) tablet 250 mg     250 mg Oral Daily 08/21/16 2301     08/21/16 2130  cefTRIAXone (ROCEPHIN) 1 g in dextrose 5 % 50 mL IVPB     1 g 100 mL/hr over 30 Minutes Intravenous  Once 08/21/16 2120 08/21/16 2208   08/21/16 2130  azithromycin (ZITHROMAX) tablet 500 mg     500 mg Oral  Once 08/21/16 2120 08/21/16 2133       Subjective:  " my back hurts."  Objective: Vitals:   08/22/16 0934 08/22/16 0952 08/22/16 0959 08/22/16 1042  BP:    (!) 223/118  Pulse:      Resp:    20  Temp:      TempSrc:      SpO2: (!) 88% (!) 87% (!) 88% (!) 87%  Weight:      Height:         Intake/Output Summary (Last 24 hours) at 08/22/16 1050 Last data filed at 08/22/16 1044  Gross per 24 hour  Intake          1811.25 ml  Output             1950 ml  Net          -138.75 ml   Filed Weights   08/21/16 1910 08/21/16 2304  Weight: 98 kg (216 lb) 97.8 kg (215 lb 9.8 oz)    Examination:  General exam: Appears calm and comfortable  Respiratory system: Wheezing bilaterally.  No rales.  Cardiovascular system: S1 & S2 heard, RRR. No JVD, murmurs, rubs, gallops or clicks. No pedal edema. Gastrointestinal system: Abdomen is nondistended, soft and nontender. No organomegaly or masses felt. Normal bowel sounds heard. Central nervous system: Alert and oriented. No focal neurological deficits. Extremities: Symmetric 5 x 5 power. Skin: No rashes, lesions or ulcers Psychiatry: Judgement and insight appear normal. Mood & affect appropriate.   Data Reviewed: I have personally reviewed following labs and imaging studies  CBC:  Recent Labs Lab 08/21/16 2009 08/21/16 2146 08/22/16 0532  WBC  --  14.1* 13.1*  NEUTROABS  --  11.1*  --   HGB 12.9* 13.3 14.3  HCT 38.0* 37.7* 42.1  MCV  --  84.5 85.9  PLT  --  188 224   Basic Metabolic Panel:  Recent Labs Lab 08/21/16 2009 08/21/16 2146 08/22/16 0532  NA 137 138 139  K 3.2* 2.9* 3.5  CL 99* 103 104  CO2  --  25 26  GLUCOSE 168* 135* 123*  BUN 25* 24* 26*  CREATININE 1.60* 1.62* 1.69*  CALCIUM  --  8.3* 8.1*   GFR: Estimated Creatinine Clearance: 58 mL/min (A) (by C-G formula based on SCr of 1.69 mg/dL (H)). Liver Function Tests:  Recent Labs Lab 08/21/16 2146  AST 32  ALT 42  ALKPHOS 152*  BILITOT 1.0  PROT 6.8  ALBUMIN 3.1*   CBG:  Recent Labs Lab 08/21/16 2310 08/22/16 0736  GLUCAP 158* 115*   Radiology Studies: Dg Chest 2 View  Result Date: 08/21/2016 CLINICAL DATA:  Chest pain, back pain. EXAM: CHEST  2 VIEW COMPARISON:  Chest x-ray dated 10/02/2014. FINDINGS: New opacity at the right lung  base, only well seen on the AP view, suspicious for developing pneumonia. Lungs otherwise clear. No pleural effusion or pneumothorax seen. Heart size and mediastinal contours are stable. Mild degenerative spurring within the thoracic spine. No acute or suspicious osseous finding. IMPRESSION: New hazy opacity at the right lung base, suspicious for early developing pneumonia. Recommend follow-up chest x-ray to ensure clearing. Electronically Signed   By: Bary RichardStan  Maynard M.D.   On: 08/21/2016 20:26    Scheduled Meds: . atorvastatin  40 mg Oral q1800  . azithromycin  250 mg Oral Daily  . cloNIDine  0.1 mg Oral TID  . enoxaparin (LOVENOX) injection  40 mg Subcutaneous Q24H  . guaiFENesin  600 mg Oral BID  . insulin aspart  0-15 Units Subcutaneous TID WC  . insulin aspart  0-5 Units Subcutaneous QHS  . ipratropium-albuterol  3 mL Nebulization Q4H  . methylphenidate  10 mg Oral TID WC  . methylPREDNISolone (SOLU-MEDROL) injection  40 mg Intravenous Q6H  . metoprolol succinate  25 mg Oral Daily  . nicotine  14 mg Transdermal Once  . nicotine  14 mg Transdermal Q24H  . potassium chloride  40 mEq Oral BID   Continuous Infusions:   LOS: 0 days   Finnleigh Marchetti, MD FACP Hospitalist.   If 7PM-7AM, please contact night-coverage [email or web address] Password [email or web address] 08/22/2016, 10:50 AM

## 2016-08-22 NOTE — Progress Notes (Addendum)
   08/22/16 0400  Vitals  Resp (!) 30  Oxygen Therapy  SpO2 95 %  O2 Device Room Air   Kim Knick RT paged with pt complaining of SOB/Difficulty breathing.  Pt states he takes two inhalilers at home though Stiolto Respimat is only inhaler noted with PTA medication.  Pt also notes he recently received CPAP and has not been routinely wearing.  RT states she will come to pt room to assess.

## 2016-08-22 NOTE — Plan of Care (Signed)
Problem: Education: Goal: Knowledge of [facility] Education information/materials will improve Outcome: Progressing General admission education initiated   Problem: Safety: Goal: Ability to remain free from injury will improve Outcome: Progressing Discussed and plan agreed upon with pt to keep pt safe from fall and infection.    Problem: Pain Managment: Goal: General experience of comfort will improve Outcome: Progressing Pt with complaints of chronic pain not relieved by percocet, explained we will not be able to use requested Toradol due to elevated BUN and Creatinine.  Pt states he knows he received a dose in the ED and it was effective at relieving pain and he would even sign a waver and testify in court if we would allow him to continue Toradol.    Problem: Bowel/Gastric: Goal: Will not experience complications related to bowel motility Outcome: Progressing Pt states he has been having issues at home with constipation.  Encouraged high fiber and PO fluids.

## 2016-08-22 NOTE — Plan of Care (Signed)
Problem: Education: Goal: Knowledge of [facility] Education information/materials will improve Outcome: Not Met (add Reason) Patient discharged AMA.  Problem: Health Behavior/Discharge Planning: Goal: Ability to manage health-related needs will improve Outcome: Not Met (add Reason) Patient discharged AMA.  Problem: Physical Regulation: Goal: Ability to maintain clinical measurements within normal limits will improve Outcome: Not Met (add Reason) Patient discharged AMA. Goal: Will remain free from infection Outcome: Not Met (add Reason) Patient discharged AMA.  Problem: Activity: Goal: Risk for activity intolerance will decrease Outcome: Not Met (add Reason) Patient discharged AMA.  Problem: Activity: Goal: Ability to tolerate increased activity will improve Outcome: Not Met (add Reason) Patient discharged AMA.  Problem: Education: Goal: Knowledge of disease or condition will improve Outcome: Not Met (add Reason) Patient discharged AMA. Goal: Knowledge of the prescribed therapeutic regimen will improve Outcome: Not Met (add Reason) Patient discharged AMA. Goal: Ability to identify and alter actions that are detrimental to health will improve Outcome: Not Met (add Reason) Patient discharged AMA.  Problem: Health Behavior/Discharge Planning: Goal: Ability to manage health-related needs will improve Outcome: Not Met (add Reason) Patient discharged AMA.  Problem: Coping: Goal: Verbalizations of decreased anxiety will increase Outcome: Not Met (add Reason) Patient discharged AMA.  Problem: Physical Regulation: Goal: Diagnostic test results will improve Outcome: Not Met (add Reason) Patient discharged AMA. Goal: Ability to maintain a body temperature in the normal range will improve Outcome: Not Met (add Reason) Patient discharged AMA.  Problem: Respiratory: Goal: Respiratory status will improve Outcome: Not Met (add Reason) Patient discharged AMA. Goal:  Ability to maintain a clear airway will improve Outcome: Not Met (add Reason) Patient discharged AMA. Goal: Pain level will decrease Outcome: Not Met (add Reason) Patient discharged AMA. Goal: Identification of resources available to assist in meeting health care needs will improve Outcome: Not Met (add Reason) Patient discharged AMA.   

## 2016-08-22 NOTE — Progress Notes (Signed)
At 2050, patient was told Nicotine patch scheduled for later. Patient did not want old Nicotine patch removed until new Nicotine patch applied. Patient was pleasant and cooperative for assessment and meds earlier in shift. At approximately 2120, this RN notified that patient was asking for his Nicotine patch, but RN was in another patient's room. At approximately 2150, patient was threatening to leave. When RN approached patient, patient was very anxious, agitated, and restless. This RN explained to patient that Nicotine patch was not due earlier as explained but could be given now. Patient stated that he was going to go outside and smoke, then come back, or smoke in his bathroom. This RN attempted to explain why this was not possible. Patient would not listen or calm down. Patient started walking towards elevator several times. This RN was able to remove IV and patient signed AMA paper. Patient had belongings with him. Patient off unit at approximately 2210. Merry ProudBrandi, Charge RN, Tim, [facility] RN, and Dr. Onalee Huaavid notified and aware.

## 2016-08-22 NOTE — Progress Notes (Signed)
Pt shouting out in pain 10/10 chronic back pain.  Pt given percocet and tylenol this morning and pt pain continues.  Dr Onalee Huaavid paged.  MD orders Morphine 4mg  IV once for breakthrough pain.

## 2016-08-23 LAB — HIV ANTIBODY (ROUTINE TESTING W REFLEX): HIV Screen 4th Generation wRfx: NONREACTIVE

## 2016-09-08 ENCOUNTER — Telehealth: Payer: Self-pay | Admitting: Orthopaedic Surgery

## 2016-09-08 NOTE — Telephone Encounter (Signed)
Patient called to relay that he has requested records to be sent to our office from Dr Ronal Fear office - have not seen that records have come through on a referral.  States he has back and hip problems, and that he was a patient of Dr Sanjuan Dame in his previous practice.  States "Dr Gerilyn Pilgrim can't see him any more."  Relays that he was treating there for pain management, and that he cannot get any medication.  I relayed that our clinic does not do pain management.  Asked patient if he is under the care of a primary care doctor.  States no, not at this time, but is working on getting established with one.  Voices understanding that we cannot schedule for pain management.

## 2017-06-15 IMAGING — US US ABDOMEN COMPLETE W/ ELASTOGRAPHY
2 series · 13 of 25 positions shown · non-contrast
Comparison: None.

CLINICAL DATA: Hepatitis C.



[Series 1: us abdomen complete w/ elastography · 0.13mm/px · 10 of 131 slices shown (1 of 2)]
[im 1/131]
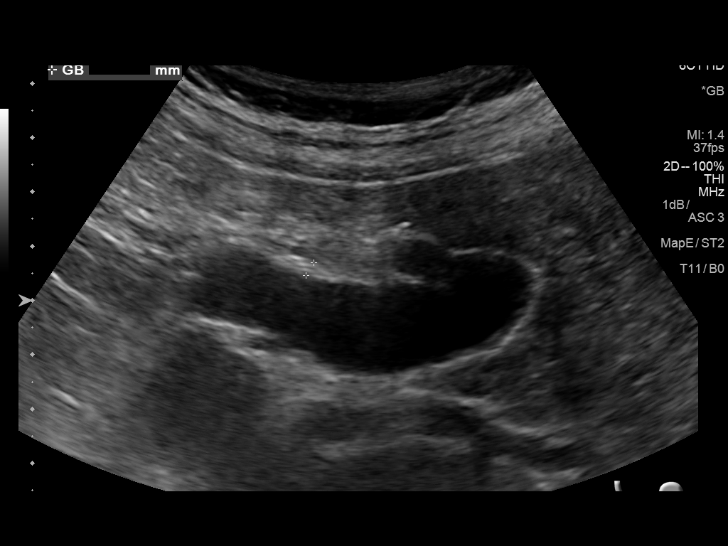
[im 14/131]
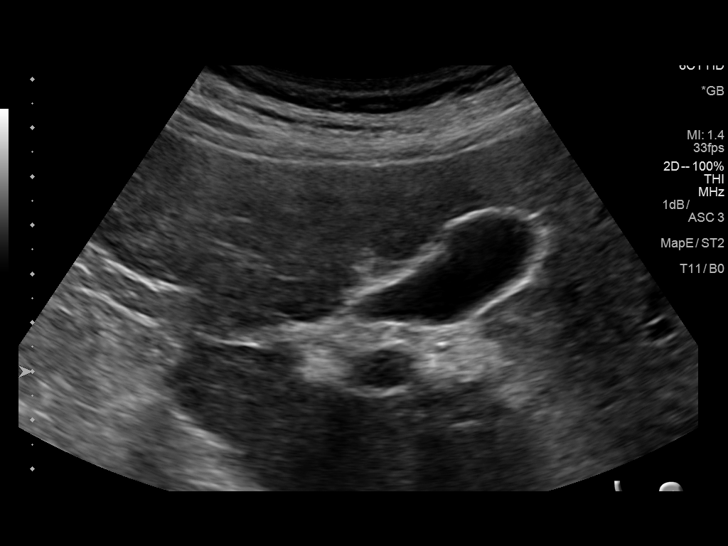
[im 28/131]
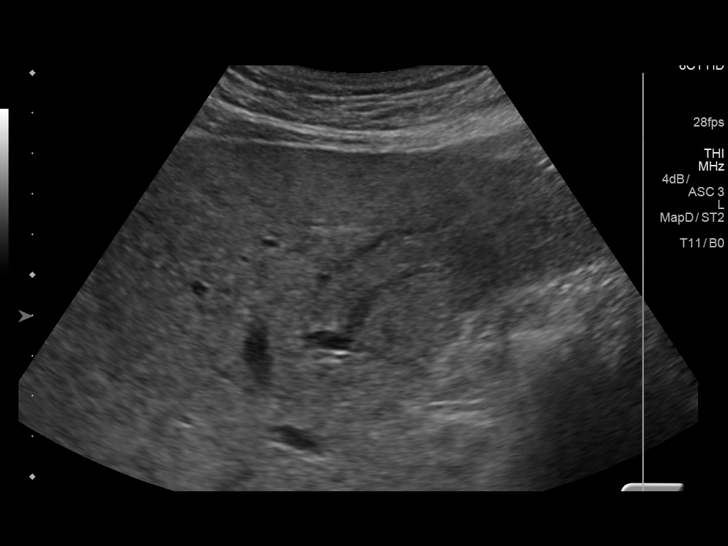
[im 42/131]
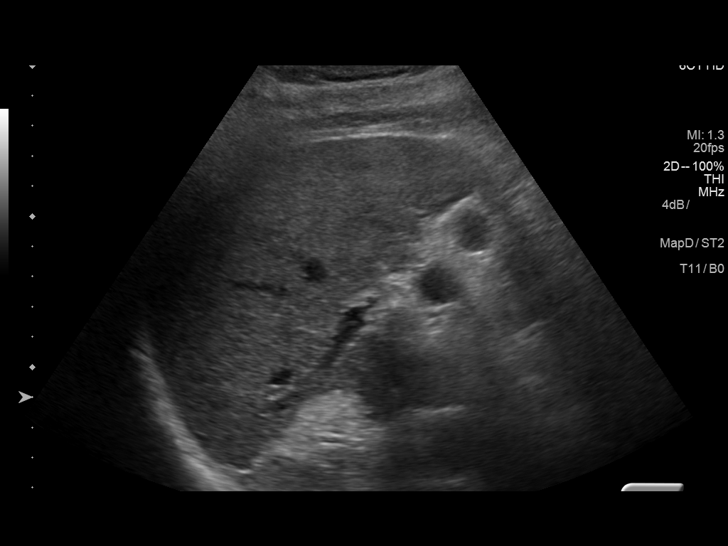
[im 55/131]
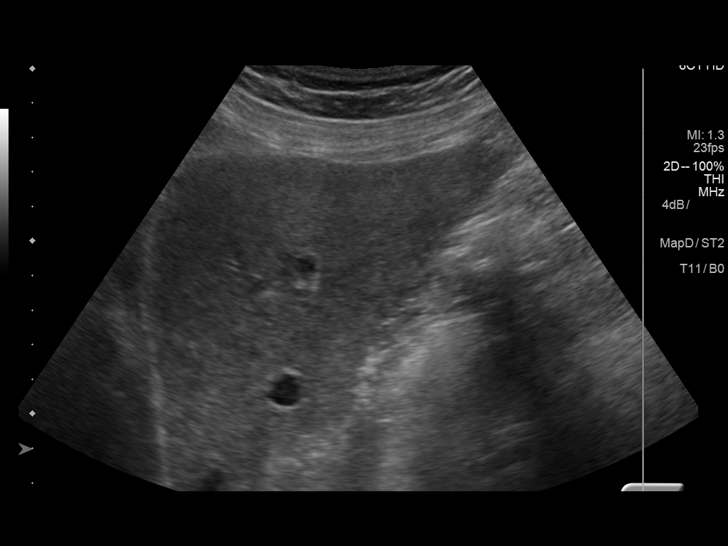
[im 69/131]
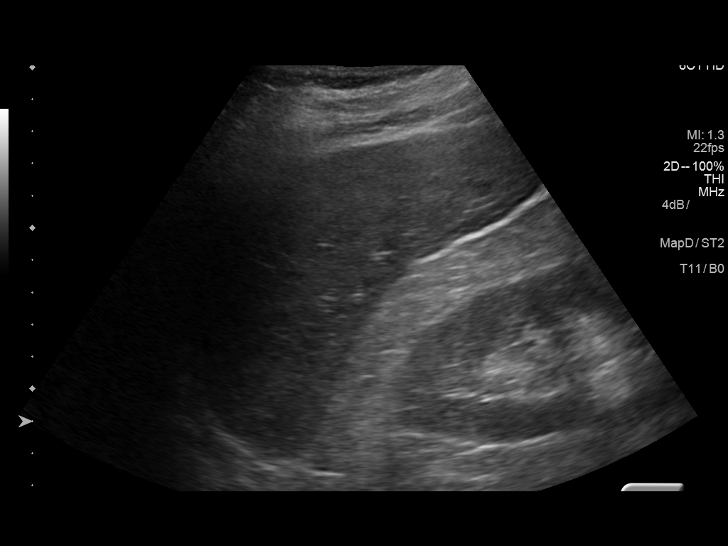
[im 83/131]
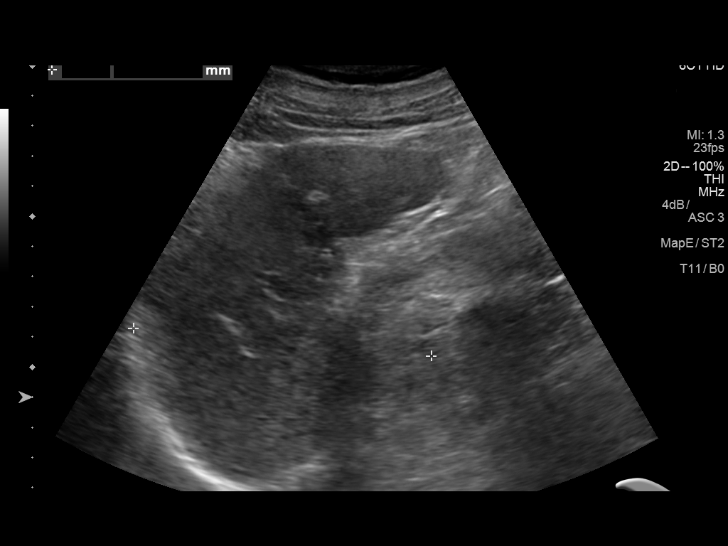
[im 96/131]
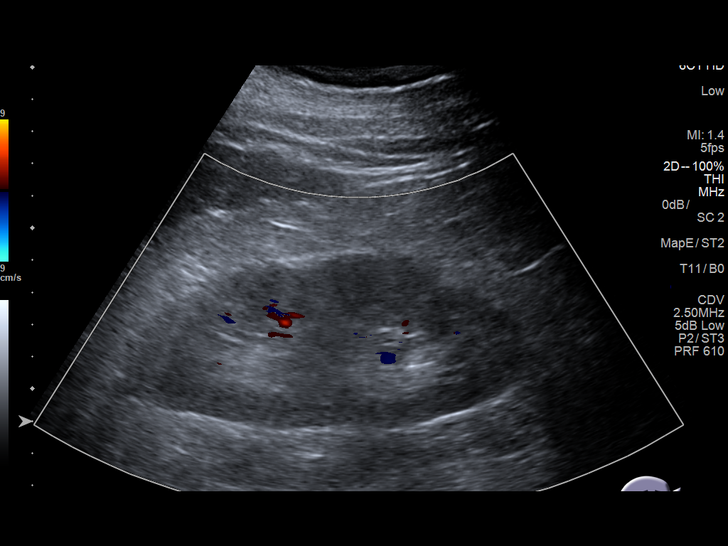
[im 110/131]
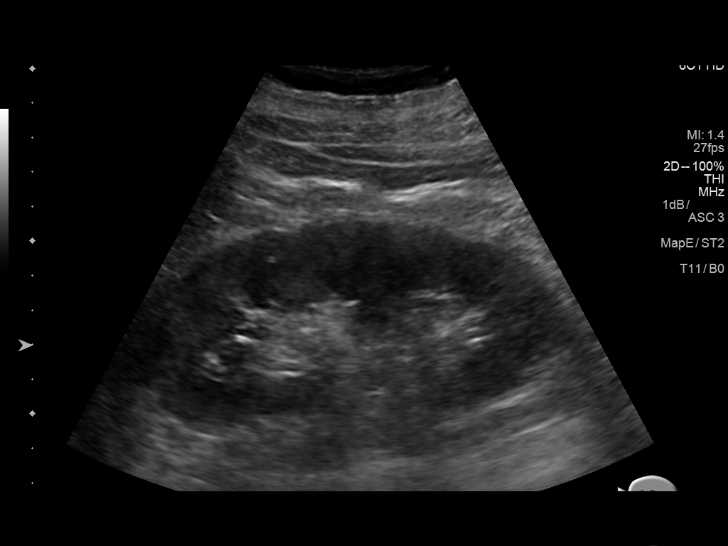
[im 124/131]
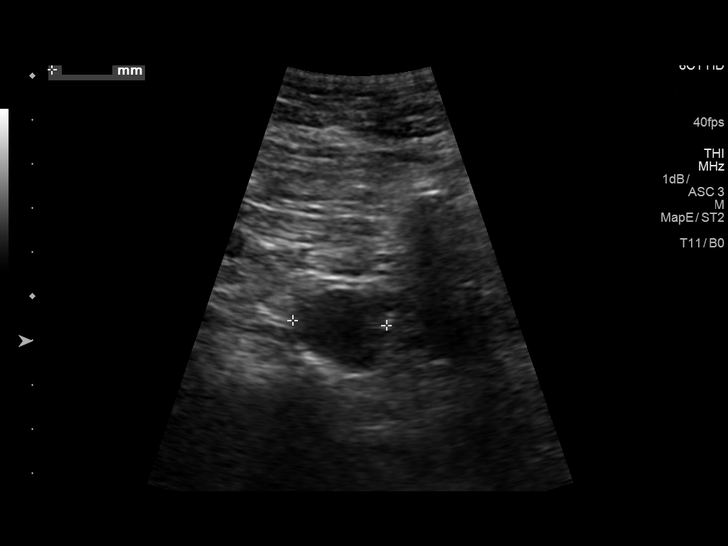

[Series 2001: us abdomen complete w/ elastography · 0.18mm/px · 3 of 35 slices shown (2 of 2)]
[im 1/35]
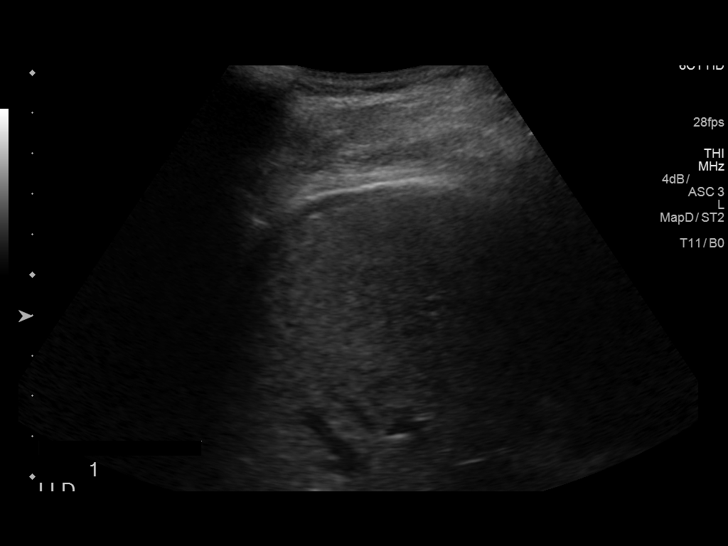
[im 18/35]
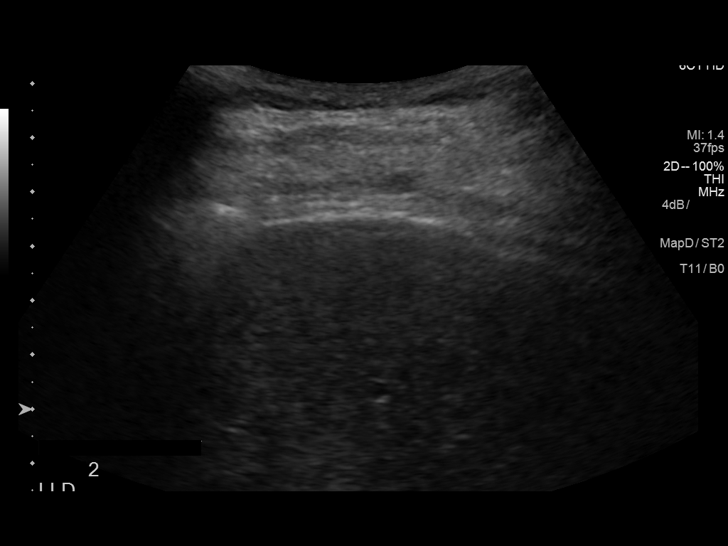
[im 35/35]
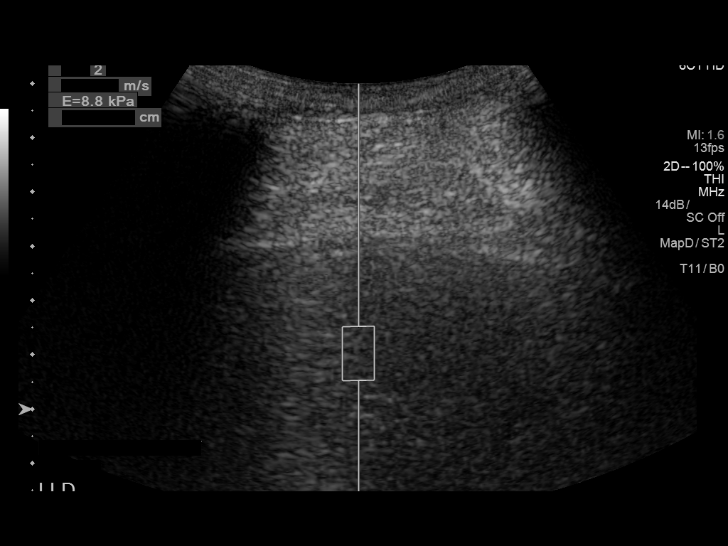

[13 of 25 positions shown; findings below may reference images not displayed]

FINDINGS: ULTRASOUND ABDOMEN

Gallbladder: No gallstones or wall thickening visualized. No
sonographic Murphy sign noted by sonographer.

Common bile duct: Diameter: 4 mm

Liver: Increased parenchymal echogenicity. The contour the liver is
slightly irregular. No focal liver abnormality.

IVC: No abnormality visualized.

Pancreas: Visualized portion unremarkable.

Spleen: Size and appearance within normal limits.

Right Kidney: Length: 12.2 cm. Echogenicity within normal limits. No
mass or hydronephrosis visualized.

Left Kidney: Length: 13.2 cm. Echogenicity within normal limits. No
mass or hydronephrosis visualized.

Abdominal aorta: Measures 3 cm.

Other findings: None.

ULTRASOUND HEPATIC ELASTOGRAPHY

Device: Siemens Helix VTQ

Patient position: Left lateral decubitus

Transducer 6C1

Number of measurements: 10

Hepatic segment:  8

Median velocity:   2.42  m/sec

IQR:

IQR/Median velocity ratio:

Corresponding Metavir fibrosis score:  Some F3 + F4

Risk of fibrosis: High

Limitations of exam: Difficulty with breath hold

Pertinent findings noted on other imaging exams:  Echogenic liver.

Please note that abnormal shear wave velocities may also be
identified in clinical settings other than with hepatic fibrosis,
such as: acute hepatitis, elevated right heart and central venous
pressures including use of beta blockers, Chaw Jwan disease
(Elemente), infiltrative processes such as
mastocytosis/amyloidosis/infiltrative tumor, extrahepatic
cholestasis, in the post-prandial state, and liver transplantation.
Correlation with patient history, laboratory data, and clinical
condition recommended.
IMPRESSION: ULTRASOUND ABDOMEN:

1. No acute findings.
2. Echogenic liver compatible with hepatic steatosis.

ULTRASOUND HEPATIC ELASTOGRAPHY:

Median hepatic shear wave velocity is calculated at 2.42 m/sec.

Corresponding Metavir fibrosis score is  Some F3 + F4.

Risk of fibrosis is High.

Follow-up: Follow up advised

## 2017-12-08 ENCOUNTER — Emergency Department (HOSPITAL_COMMUNITY): Payer: Medicaid Other

## 2017-12-08 ENCOUNTER — Observation Stay (HOSPITAL_COMMUNITY): Payer: Medicaid Other

## 2017-12-08 ENCOUNTER — Inpatient Hospital Stay (HOSPITAL_COMMUNITY)
Admission: EM | Admit: 2017-12-08 | Discharge: 2017-12-10 | DRG: 300 | Discharge status: Against Medical Advice | Payer: Medicaid Other | Attending: Infectious Disease | Admitting: Infectious Disease

## 2017-12-08 ENCOUNTER — Encounter (HOSPITAL_COMMUNITY): Payer: Self-pay

## 2017-12-08 ENCOUNTER — Other Ambulatory Visit: Payer: Self-pay

## 2017-12-08 DIAGNOSIS — I7101 Dissection of thoracic aorta: Principal | ICD-10-CM | POA: Diagnosis present

## 2017-12-08 DIAGNOSIS — R072 Precordial pain: Secondary | ICD-10-CM | POA: Diagnosis not present

## 2017-12-08 DIAGNOSIS — I248 Other forms of acute ischemic heart disease: Secondary | ICD-10-CM | POA: Diagnosis present

## 2017-12-08 DIAGNOSIS — M479 Spondylosis, unspecified: Secondary | ICD-10-CM | POA: Diagnosis present

## 2017-12-08 DIAGNOSIS — E119 Type 2 diabetes mellitus without complications: Secondary | ICD-10-CM | POA: Diagnosis present

## 2017-12-08 DIAGNOSIS — F418 Other specified anxiety disorders: Secondary | ICD-10-CM | POA: Diagnosis present

## 2017-12-08 DIAGNOSIS — I71019 Dissection of thoracic aorta, unspecified: Secondary | ICD-10-CM | POA: Diagnosis present

## 2017-12-08 DIAGNOSIS — K760 Fatty (change of) liver, not elsewhere classified: Secondary | ICD-10-CM | POA: Diagnosis not present

## 2017-12-08 DIAGNOSIS — F17210 Nicotine dependence, cigarettes, uncomplicated: Secondary | ICD-10-CM | POA: Diagnosis not present

## 2017-12-08 DIAGNOSIS — I2510 Atherosclerotic heart disease of native coronary artery without angina pectoris: Secondary | ICD-10-CM | POA: Diagnosis present

## 2017-12-08 DIAGNOSIS — E039 Hypothyroidism, unspecified: Secondary | ICD-10-CM | POA: Diagnosis present

## 2017-12-08 DIAGNOSIS — Z79891 Long term (current) use of opiate analgesic: Secondary | ICD-10-CM

## 2017-12-08 DIAGNOSIS — Z79899 Other long term (current) drug therapy: Secondary | ICD-10-CM

## 2017-12-08 DIAGNOSIS — I5032 Chronic diastolic (congestive) heart failure: Secondary | ICD-10-CM | POA: Diagnosis present

## 2017-12-08 DIAGNOSIS — I10 Essential (primary) hypertension: Secondary | ICD-10-CM

## 2017-12-08 DIAGNOSIS — Z7984 Long term (current) use of oral hypoglycemic drugs: Secondary | ICD-10-CM | POA: Diagnosis not present

## 2017-12-08 DIAGNOSIS — I7102 Dissection of abdominal aorta: Secondary | ICD-10-CM | POA: Diagnosis not present

## 2017-12-08 DIAGNOSIS — N4289 Other specified disorders of prostate: Secondary | ICD-10-CM | POA: Diagnosis present

## 2017-12-08 DIAGNOSIS — E785 Hyperlipidemia, unspecified: Secondary | ICD-10-CM | POA: Diagnosis not present

## 2017-12-08 DIAGNOSIS — Z981 Arthrodesis status: Secondary | ICD-10-CM

## 2017-12-08 DIAGNOSIS — I160 Hypertensive urgency: Secondary | ICD-10-CM

## 2017-12-08 DIAGNOSIS — B1920 Unspecified viral hepatitis C without hepatic coma: Secondary | ICD-10-CM | POA: Diagnosis present

## 2017-12-08 DIAGNOSIS — E1169 Type 2 diabetes mellitus with other specified complication: Secondary | ICD-10-CM | POA: Diagnosis not present

## 2017-12-08 DIAGNOSIS — E663 Overweight: Secondary | ICD-10-CM | POA: Diagnosis present

## 2017-12-08 DIAGNOSIS — Z5329 Procedure and treatment not carried out because of patient's decision for other reasons: Secondary | ICD-10-CM | POA: Diagnosis not present

## 2017-12-08 DIAGNOSIS — N400 Enlarged prostate without lower urinary tract symptoms: Secondary | ICD-10-CM | POA: Diagnosis not present

## 2017-12-08 DIAGNOSIS — E7800 Pure hypercholesterolemia, unspecified: Secondary | ICD-10-CM | POA: Diagnosis not present

## 2017-12-08 DIAGNOSIS — R079 Chest pain, unspecified: Secondary | ICD-10-CM | POA: Diagnosis present

## 2017-12-08 DIAGNOSIS — Z8679 Personal history of other diseases of the circulatory system: Secondary | ICD-10-CM

## 2017-12-08 DIAGNOSIS — Z720 Tobacco use: Secondary | ICD-10-CM | POA: Diagnosis not present

## 2017-12-08 DIAGNOSIS — Z833 Family history of diabetes mellitus: Secondary | ICD-10-CM

## 2017-12-08 DIAGNOSIS — Z8249 Family history of ischemic heart disease and other diseases of the circulatory system: Secondary | ICD-10-CM

## 2017-12-08 DIAGNOSIS — J449 Chronic obstructive pulmonary disease, unspecified: Secondary | ICD-10-CM | POA: Diagnosis present

## 2017-12-08 DIAGNOSIS — I110 Hypertensive heart disease with heart failure: Secondary | ICD-10-CM | POA: Diagnosis present

## 2017-12-08 DIAGNOSIS — I252 Old myocardial infarction: Secondary | ICD-10-CM | POA: Diagnosis not present

## 2017-12-08 DIAGNOSIS — K7460 Unspecified cirrhosis of liver: Secondary | ICD-10-CM | POA: Diagnosis not present

## 2017-12-08 DIAGNOSIS — Z8261 Family history of arthritis: Secondary | ICD-10-CM

## 2017-12-08 DIAGNOSIS — Z811 Family history of alcohol abuse and dependence: Secondary | ICD-10-CM

## 2017-12-08 LAB — BASIC METABOLIC PANEL
Anion gap: 10 (ref 5–15)
BUN: 19 mg/dL (ref 6–20)
CO2: 27 mmol/L (ref 22–32)
Calcium: 9.2 mg/dL (ref 8.9–10.3)
Chloride: 101 mmol/L (ref 98–111)
Creatinine, Ser: 1.1 mg/dL (ref 0.61–1.24)
GFR calc Af Amer: >60 mL/min (ref >60)
GFR calc non Af Amer: >60 mL/min (ref >60)
GLUCOSE: 93 mg/dL (ref 70–99)
POTASSIUM: 3.5 mmol/L (ref 3.5–5.1)
Sodium: 138 mmol/L (ref 135–145)

## 2017-12-08 LAB — URINALYSIS, ROUTINE W REFLEX MICROSCOPIC
Bilirubin Urine: NEGATIVE
GLUCOSE, UA: NEGATIVE mg/dL
Hgb urine dipstick: NEGATIVE
KETONES UR: NEGATIVE mg/dL
LEUKOCYTES UA: NEGATIVE
NITRITE: NEGATIVE
PROTEIN: NEGATIVE mg/dL
Specific Gravity, Urine: 1.017 (ref 1.005–1.030)
pH: 7 (ref 5.0–8.0)

## 2017-12-08 LAB — TSH: TSH: 11.037 u[IU]/mL — AB (ref 0.350–4.500)

## 2017-12-08 LAB — PHOSPHORUS: Phosphorus: 3.8 mg/dL (ref 2.5–4.6)

## 2017-12-08 LAB — RAPID URINE DRUG SCREEN, HOSP PERFORMED
Amphetamines: NOT DETECTED
BARBITURATES: NOT DETECTED
Benzodiazepines: NOT DETECTED
COCAINE: NOT DETECTED
Opiates: NOT DETECTED
TETRAHYDROCANNABINOL: NOT DETECTED

## 2017-12-08 LAB — CBC
HEMATOCRIT: 44 % (ref 39.0–52.0)
HEMOGLOBIN: 14.7 g/dL (ref 13.0–17.0)
MCH: 27.9 pg (ref 26.0–34.0)
MCHC: 33.4 g/dL (ref 30.0–36.0)
MCV: 83.5 fL (ref 80.0–100.0)
Platelets: 199 10*3/uL (ref 150–400)
RBC: 5.27 MIL/uL (ref 4.22–5.81)
RDW: 13.5 % (ref 11.5–15.5)
WBC: 8.7 10*3/uL (ref 4.0–10.5)
nRBC: 0 % (ref 0.0–0.2)

## 2017-12-08 LAB — TROPONIN I: Troponin I: <0.03 ng/mL (ref <0.03)

## 2017-12-08 LAB — MAGNESIUM: MAGNESIUM: 2 mg/dL (ref 1.7–2.4)

## 2017-12-08 MED ORDER — LORAZEPAM 2 MG/ML IJ SOLN
1.0000 mg | Freq: Once | INTRAMUSCULAR | Status: AC
  Administered 2017-12-08: 1 mg via INTRAVENOUS

## 2017-12-08 MED ORDER — SODIUM CHLORIDE 0.45 % IV SOLN
INTRAVENOUS | Status: DC
  Administered 2017-12-08 – 2017-12-09 (×3): via INTRAVENOUS

## 2017-12-08 MED ORDER — ACETAMINOPHEN 325 MG PO TABS
650.0000 mg | ORAL_TABLET | Freq: Four times a day (QID) | ORAL | Status: DC | PRN
  Administered 2017-12-09: 650 mg via ORAL
  Filled 2017-12-08: qty 2

## 2017-12-08 MED ORDER — MORPHINE SULFATE (PF) 4 MG/ML IV SOLN
4.0000 mg | INTRAVENOUS | Status: DC | PRN
  Administered 2017-12-10: 4 mg via INTRAVENOUS
  Filled 2017-12-08: qty 1

## 2017-12-08 MED ORDER — ACETAMINOPHEN 650 MG RE SUPP: 650.0000 mg | Freq: Four times a day (QID) | RECTAL | Status: DC | PRN

## 2017-12-08 MED ORDER — ONDANSETRON HCL 4 MG/2ML IJ SOLN: 4.0000 mg | Freq: Four times a day (QID) | INTRAMUSCULAR | Status: DC | PRN

## 2017-12-08 MED ORDER — NICOTINE 21 MG/24HR TD PT24
21.0000 mg | MEDICATED_PATCH | Freq: Every day | TRANSDERMAL | Status: DC
  Administered 2017-12-08 – 2017-12-10 (×3): 21 mg via TRANSDERMAL
  Filled 2017-12-08 (×3): qty 1

## 2017-12-08 MED ORDER — LORAZEPAM 2 MG/ML IJ SOLN
INTRAMUSCULAR | Status: AC
  Administered 2017-12-08: 1 mg via INTRAVENOUS
  Filled 2017-12-08: qty 1

## 2017-12-08 MED ORDER — HYDRALAZINE HCL 20 MG/ML IJ SOLN
20.0000 mg | Freq: Once | INTRAMUSCULAR | Status: AC
  Administered 2017-12-08: 20 mg via INTRAVENOUS

## 2017-12-08 MED ORDER — AMLODIPINE BESYLATE 5 MG PO TABS
ORAL_TABLET | ORAL | Status: AC
  Administered 2017-12-08: 10 mg via ORAL
  Filled 2017-12-08: qty 2

## 2017-12-08 MED ORDER — POTASSIUM CHLORIDE CRYS ER 20 MEQ PO TBCR
40.0000 meq | EXTENDED_RELEASE_TABLET | Freq: Once | ORAL | Status: AC
  Administered 2017-12-08: 40 meq via ORAL
  Filled 2017-12-08: qty 2

## 2017-12-08 MED ORDER — CLONIDINE HCL 0.1 MG PO TABS
0.1000 mg | ORAL_TABLET | Freq: Three times a day (TID) | ORAL | Status: AC
  Administered 2017-12-08 – 2017-12-09 (×4): 0.1 mg via ORAL
  Filled 2017-12-08 (×4): qty 1

## 2017-12-08 MED ORDER — ENOXAPARIN SODIUM 40 MG/0.4ML ~~LOC~~ SOLN
40.0000 mg | SUBCUTANEOUS | Status: DC
  Administered 2017-12-08: 40 mg via SUBCUTANEOUS
  Filled 2017-12-08: qty 0.4

## 2017-12-08 MED ORDER — LABETALOL HCL 5 MG/ML IV SOLN
0.5000 mg/min | INTRAVENOUS | Status: DC
  Filled 2017-12-08: qty 100

## 2017-12-08 MED ORDER — ENOXAPARIN SODIUM 40 MG/0.4ML ~~LOC~~ SOLN: 40.0000 mg | SUBCUTANEOUS | Status: DC

## 2017-12-08 MED ORDER — ONDANSETRON HCL 4 MG PO TABS: 4.0000 mg | ORAL_TABLET | Freq: Four times a day (QID) | ORAL | Status: DC | PRN

## 2017-12-08 MED ORDER — AMLODIPINE BESYLATE 10 MG PO TABS
10.0000 mg | ORAL_TABLET | Freq: Every day | ORAL | Status: DC
  Administered 2017-12-08: 10 mg via ORAL

## 2017-12-08 MED ORDER — IOPAMIDOL (ISOVUE-370) INJECTION 76%
100.0000 mL | Freq: Once | INTRAVENOUS | Status: AC | PRN
  Administered 2017-12-08: 100 mL via INTRAVENOUS

## 2017-12-08 MED ORDER — CLONIDINE HCL 0.1 MG PO TABS
0.1000 mg | ORAL_TABLET | Freq: Once | ORAL | Status: AC
  Administered 2017-12-08: 0.1 mg via ORAL
  Filled 2017-12-08: qty 1

## 2017-12-08 MED ORDER — HYDRALAZINE HCL 20 MG/ML IJ SOLN
INTRAMUSCULAR | Status: AC
  Filled 2017-12-08: qty 1

## 2017-12-08 NOTE — ED Provider Notes (Signed)
[facility] EMERGENCY DEPARTMENT Provider Note   CSN: [id number] Arrival date & time: 12/08/17  1816     History   Chief Complaint Chief Complaint  Patient presents with  . Chest Pain    HPI Trevor [name] is a 52 y.o. male.   Patient with acute onset of substernal chest pain 1 hour prior to arrival.  EMS was called.  They gave him aspirin and 2 nitroglycerin.  And pain completely resolved.  Pain was substernal.  Constant in nature.  Patient also has a history of hypertension.  Patient arrived here with markedly elevated blood pressure and states that he has not had any of his clonidine for the past 3 days.  Patient denies any nausea or vomiting.  Patient is now completely asymptomatic.  Comfortable in appearance.     Past Medical History:  Diagnosis Date  . Anxiety   . Arthritis   . COPD (chronic obstructive pulmonary disease) (HCC)   . Depression   . Diabetes mellitus   . Hepatitis C   . High cholesterol   . Hypertension   . Hypothyroidism   . MI, old   . Prostate disease   . Shortness of breath dyspnea     Patient Active Problem List   Diagnosis Date Noted  . Acute renal injury (HCC) 08/21/2016  . Community acquired pneumonia 08/21/2016  . Tobacco abuse 08/21/2016  . Back pain 08/21/2016  . Hypokalemia 08/21/2016  . HCV antibody positive 10/01/2015  . Spondylolisthesis of lumbar region 03/05/2015  . Hypertension   . High cholesterol   . Fall 08/06/2014  . Small intestine injury 08/06/2014  . Closed fracture of transverse process of lumbar vertebra (HCC) 08/05/2014  . Syncope 07/18/2014    Past Surgical History:  Procedure Laterality Date  . FINGER SURGERY    . HERNIA REPAIR    . KNEE SURGERY    . LAPAROTOMY N/A 08/05/2014   Procedure: EXPLORATORY LAPAROTOMY, SMALL BOWEL RESECTION;  Surgeon: Manus Rudd, MD;  Location: [facility] OR;  Service: General;  Laterality: N/A;  . WRIST SURGERY          Home Medications    Prior to Admission  medications   Medication Sig Start Date End Date Taking? Authorizing Provider  atorvastatin (LIPITOR) 40 MG tablet Take 40 mg by mouth daily.    [provider]  cloNIDine (CATAPRES) 0.1 MG tablet Take 0.1 mg by mouth 3 (three) times daily.     [provider]  Cyanocobalamin (B-12 PO) Take 1 tablet by mouth daily.    [provider]  gabapentin (NEURONTIN) 800 MG tablet Take 800 mg by mouth 2 (two) times daily.     [provider]  lisinopril (PRINIVIL,ZESTRIL) 20 MG tablet Take 20 mg by mouth daily.    [provider]  metFORMIN (GLUCOPHAGE-XR) 500 MG 24 hr tablet Take 500 mg by mouth every evening.    [provider]  methylphenidate (RITALIN) 10 MG tablet Take 10 mg by mouth 3 (three) times daily with meals.    [provider]  metoprolol succinate (TOPROL-XL) 25 MG 24 hr tablet Take 12.5 mg by mouth daily.    [provider]  oxyCODONE-acetaminophen (PERCOCET) 7.5-325 MG tablet Take 1 tablet by mouth 3 (three) times daily as needed for moderate pain or severe pain.    [provider]  tamsulosin (FLOMAX) 0.4 MG CAPS capsule Take 0.4 mg by mouth daily.    [provider]  Tiotropium Bromide-Olodaterol (STIOLTO RESPIMAT)  2.5-2.5 MCG/ACT AERS Inhale 1 puff into the lungs 2 (two) times daily as needed (for shortness of breath).    [provider]    Family History Family History  Problem Relation Age of Onset  . Alcohol abuse Father   . Arthritis Father   . Heart disease Father   . Hypertension Father   . Alcohol abuse Mother   . Arthritis Mother   . Diabetes Mother   . Heart disease Mother   . Hypertension Mother   . Alcohol abuse Brother   . Hypertension Brother   . Colon cancer Neg Hx     Social History Social History   Tobacco Use  . Smoking status: Current Every Day Smoker    Packs/day: 0.50    Years: 30.00    Pack years: 15.00    Types: Cigarettes    Start date: 05/21/1982    . Smokeless tobacco: Never Used  Substance Use Topics  . Alcohol use: No    Alcohol/week: 0.0 standard drinks    Comment: Quit drinking 3 years ago; Previously drank about a fifth of liquor per week.  . Drug use: No    Frequency: 7.0 times per week    Types: Marijuana    Comment: "smokes several times a day for pain management."     Allergies   Ivp dye [iodinated diagnostic agents]   Review of Systems Review of Systems  Constitutional: Negative for fever.  HENT: Negative for congestion.   Eyes: Negative for photophobia.  Respiratory: Positive for shortness of breath.   Cardiovascular: Positive for chest pain.  Gastrointestinal: Negative for abdominal pain, nausea and vomiting.  Genitourinary: Negative for dysuria.  Musculoskeletal: Negative for back pain.  Skin: Negative for rash.  Neurological: Negative for headaches.  Hematological: Does not bruise/bleed easily.  Psychiatric/Behavioral: Negative for confusion.     Physical Exam Updated Vital Signs BP (!) 214/118 (BP Location: Left Arm)   Pulse 78   Temp 98.3 F (36.8 C) (Oral)   Resp 19   SpO2 99%   Physical Exam  Constitutional: He is oriented to person, place, and time. He appears well-developed and well-nourished. No distress.  HENT:  Head: Normocephalic and atraumatic.  Mouth/Throat: Oropharynx is clear and moist.  Eyes: Pupils are equal, round, and reactive to light. Conjunctivae and EOM are normal.  Neck: Neck supple.  Cardiovascular: Normal rate, regular rhythm and normal heart sounds.  No murmur heard. Pulmonary/Chest: Effort normal and breath sounds normal. No respiratory distress.  Abdominal: Soft. Bowel sounds are normal. He exhibits no distension and no mass. There is no tenderness. There is no guarding.  Musculoskeletal: Normal range of motion. He exhibits no edema.  Neurological: He is alert and oriented to person, place, and time. No cranial nerve deficit. He exhibits normal muscle tone.  Coordination normal.  Skin: Skin is warm.  Nursing note and vitals reviewed.    ED Treatments / Results  Labs (all labs ordered are listed, but only abnormal results are displayed) Labs Reviewed  BASIC METABOLIC PANEL  CBC  TROPONIN I    EKG EKG Interpretation  Date/Time:  Thursday December 08 2017 18:24:35 EST Ventricular Rate:  82 PR Interval:    QRS Duration: 96 QT Interval:  391 QTC Calculation: 457 R Axis:   6 Text Interpretation:  Sinus rhythm Probable left atrial enlargement Abnormal R-wave progression, early transition Borderline T wave abnormalities Confirmed by Vanetta Mulders [phone]) on 12/08/2017 6:52:40 PM   Radiology No results found.  Procedures  Procedures (including critical care time)  Medications Ordered in ED Medications  cloNIDine (CATAPRES) tablet 0.1 mg (has no administration in time range)     Initial Impression / Assessment and Plan / ED Course  I have reviewed the triage vital signs and the nursing notes.  Pertinent labs & imaging results that were available during my care of the patient were reviewed by me and considered in my medical decision making (see chart for details).     [facility].  Patient had the onset of substernal chest pain that lasted for an hour was relieved with aspirin and 2 nitro.  Now is pain-free.  Medications were given by EMS.  Associate with some shortness of breath no nausea or vomiting.  Patient has a history of hypertension.  Blood pressures were markedly elevated systolic 204 to 114.  Patient normally on clonidine but has not had it for the past 3 days.  Patient given a dose of clonidine here 0.1.  Did not help much.  Patient's work-up for the chest pain that is now resolved initial troponin was negative chest x-ray raise concerns for thoracic aneurysm and said that it was unchanged.  But we could not verify an old study that showed it.  Could have been done outside our system.  Based on this we  wanted to do CT scan to rule out dissection.  A little bit of a delay due to the fact that he has contrast allergy.  But it may have just been due to renal function.  Patient discussed with hospitalist will need admission for the chest pain rule out also will need admission for the hypertensive urgency.  And patient will need further evaluation of the thoracic aneurysm which seems to be stable.  Also with patient being pain-free most likely not dissecting.   In addition I discussed with hospitalist.  They will address further medications to handle the hyper tension.   Final Clinical Impressions(s) / ED Diagnoses   Final diagnoses:  Precordial pain  Essential hypertension    ED Discharge Orders    None      Addendum.  After in the care of the hospitalist.  CT angios was done actually showed a dissection.  [facility]  Acute medication started to control the pressure.  Dr. Robb Matarrtiz arrange [facility] unit also spoke with cardiothoracic surgery.   Vanetta MuldersZackowski, Sascha Palma, MD 12/09/17 [phone]

## 2017-12-08 NOTE — ED Triage Notes (Signed)
Pt c/o of central chest pain. BP elevated but takes BP meds in jail.

## 2017-12-08 NOTE — ED Notes (Signed)
Dr. Robb Matarrtiz contacted and patient not going to 300 floor due to CT results and hypertension.

## 2017-12-08 NOTE — H&P (Signed)
History and Physical    Trevor [name] ZOX:[id number] DOB: 10/22/1965 DOA: 12/08/2017  PCP: Patient, No Pcp Per   [facility]  I have personally briefly reviewed patient[facility]  Chief Complaint: Chest pain and high blood pressure.  HPI: Trevor [name] is a 52 y.o. male with medical history significant of Anxiety, osteoarthritis, COPD, depression, type 2 diabetes, hep C, hyperlipidemia, hypertension, hypothyroidism, CAD, history of MI, prostate disease who [facility] hypertension and chest pain.  The patient stated that he has not had his antihypertensives in several days.  These medications include clonidine 0.1 mg p.o. 3 times daily.  The pain was sharp.  It was associated with mild dyspnea, fatigue and dizziness.  He denies nausea, emesis, palpitations, recent PND, orthopnea or pitting edema of the lower extremities he denies fever, chills, sore throat, wheezing or hemoptysis.  No abdominal pain, diarrhea, constipation, melena or hematochezia.  Denies dysuria, frequency or hematuria.  ED Course: His initial vital signs temperature 98.3 F, pulse 78, respiration 19, blood pressure 214/118 mmHg and O2 sat 99% on room air.  He received 2 doses of clonidine 0.1 mg p.o. x1.  I added Norvasc 10 mg p.o. x1, K-Lor 40 mEq p.o. x1 and lorazepam 1 mg IVP.  His urinalysis, UDS, CBC, troponin, BMP, phosphorus and magnesium were within normal limits.  EKG has a normal R wave progression, early transition with borderline T wave abnormalities.  Imaging: Chest CTA was performed which showed thoracic aortic dissection beginning just distal to the right brachiocephalic artery in origin and involving the left common carotid artery with extension into the abdominal aorta and bilateral iliac arteries.  True lumen is decompressed.  The celiac axis superior mesenteric artery, bilateral renal arteries, iliac  arteries and inferior mesenteric artery appeared to arise from the true lumen.  No evidence of rupture.  Correlation with most recent previous study was limited because the study was noncontrast, but this was probably present previously since the aorta was dilated at that time and displaced intimal calcifications to be seen in the abdomen.  There was also probable hepatic cirrhosis with fatty infiltration of the liver.  Femoral bypass graft appears patent.  Please see images and full radiology report for further detail.  Review of Systems: As per HPI otherwise 10 point review of systems negative.   Past Medical History:  Diagnosis Date  . Anxiety   . Arthritis   . COPD (chronic obstructive pulmonary disease) (HCC)   . Depression   . Diabetes mellitus   . Hepatitis C   . High cholesterol   . Hypertension   . Hypothyroidism   . MI, old   . Prostate disease   . Shortness of breath dyspnea     Past Surgical History:  Procedure Laterality Date  . FINGER SURGERY    . HERNIA REPAIR    . KNEE SURGERY    . LAPAROTOMY N/A 08/05/2014   Procedure: EXPLORATORY LAPAROTOMY, SMALL BOWEL RESECTION;  Surgeon: Manus RuddMatthew Tsuei, MD;  Location: [facility] OR;  Service: General;  Laterality: N/A;  . WRIST SURGERY       reports that he has been smoking cigarettes. He started smoking about 35 years ago. He has a 15.00 pack-year smoking history. He has never used smokeless tobacco. He reports that he does not drink alcohol or use drugs.  Allergies  Allergen Reactions  . Ivp Dye [Iodinated Diagnostic Agents] Other (  See Comments)    Kidney Failure - Per [facility]     Family History  Problem Relation Age of Onset  . Alcohol abuse Father   . Arthritis Father   . Heart disease Father   . Hypertension Father   . Alcohol abuse Mother   . Arthritis Mother   . Diabetes Mother   . Heart disease Mother   . Hypertension Mother   . Alcohol abuse Brother   . Hypertension Brother   . Colon cancer Neg Hx    Prior to  Admission medications   Medication Sig Start Date End Date Taking? Authorizing Provider  amLODipine (NORVASC) 10 MG tablet Take 2 tablets by mouth daily. 09/25/17  Yes [provider]  naproxen sodium (ALEVE) 220 MG tablet Take 220 mg by mouth 2 (two) times daily as needed.   Yes [provider]  cloNIDine (CATAPRES) 0.1 MG tablet Take 0.1 mg by mouth 3 (three) times daily.     [provider]  Cyanocobalamin (B-12 PO) Take 1 tablet by mouth daily.    [provider]  gabapentin (NEURONTIN) 800 MG tablet Take 800 mg by mouth 2 (two) times daily.     [provider]  hydrochlorothiazide (HYDRODIURIL) 25 MG tablet Take 25 mg by mouth daily.    [provider]  lisinopril (PRINIVIL,ZESTRIL) 20 MG tablet Take 10 mg by mouth daily.     [provider]  metFORMIN (GLUCOPHAGE-XR) 500 MG 24 hr tablet Take 500 mg by mouth every evening.    [provider]  methylphenidate (RITALIN) 10 MG tablet Take 10 mg by mouth 3 (three) times daily with meals.    [provider]  metoprolol succinate (TOPROL-XL) 25 MG 24 hr tablet Take 12.5 mg by mouth daily.    [provider]  oxyCODONE-acetaminophen (PERCOCET) 7.5-325 MG tablet Take 1 tablet by mouth 3 (three) times daily as needed for moderate pain or severe pain.    [provider]  tamsulosin (FLOMAX) 0.4 MG CAPS capsule Take 0.4 mg by mouth daily.    [provider]  Tiotropium Bromide-Olodaterol (STIOLTO RESPIMAT) 2.5-2.5 MCG/ACT AERS Inhale 1 puff into the lungs 2 (two) times daily as needed (for shortness of breath).    [provider]    Physical Exam: Vitals:   12/08/17 2032 12/08/17 2100 12/08/17 2130 12/08/17 2200  BP: (!) 188/104 (!) 226/136    Pulse: 78 96 91 96  Resp: 15 (!) 22 17 18   Temp: 98.2 F (36.8 C)     TempSrc: Oral     SpO2: 98% 99% 98% 100%    Constitutional: Disheveled.  NAD, calm, comfortable Eyes: PERRL, lids and  conjunctivae normal ENMT: Mucous membranes are moist. Posterior pharynx clear of any exudate or lesions. Neck: normal, supple, no masses, no thyromegaly Respiratory: clear to auscultation bilaterally, no wheezing, no crackles. Normal respiratory effort. No accessory muscle use.  Cardiovascular: Regular rate and rhythm, no murmurs / rubs / gallops. No extremity edema. 2+ pedal pulses. No carotid bruits.  Abdomen: Obese, soft, no tenderness, no masses palpated. No hepatosplenomegaly. Bowel sounds positive.  Musculoskeletal: no clubbing / cyanosis.  Good ROM, no contractures. Normal muscle tone.  Skin: Multiple hyperpigmented macules on torso. Neurologic: CN 2-12 grossly intact. Sensation intact, DTR normal. Strength 5/5 in all 4.  Psychiatric: Normal judgment and insight. Alert and oriented x 3. Normal mood.   Labs on Admission: I have personally reviewed following labs and imaging studies  CBC: Recent Labs  Lab 12/08/17 1854  WBC 8.7  HGB 14.7  HCT 44.0  MCV 83.5  PLT 199   Basic Metabolic Panel: Recent Labs  Lab 12/08/17 1854  NA 138  K 3.5  CL 101  CO2 27  GLUCOSE 93  BUN 19  CREATININE 1.10  CALCIUM 9.2   GFR: CrCl cannot be calculated (Unknown ideal weight.). Liver Function Tests: No results for input(s): AST, ALT, ALKPHOS, BILITOT, PROT, ALBUMIN in the last 168 hours. No results for input(s): LIPASE, AMYLASE in the last 168 hours. No results for input(s): AMMONIA in the last 168 hours. Coagulation Profile: No results for input(s): INR, PROTIME in the last 168 hours. Cardiac Enzymes: Recent Labs  Lab 12/08/17 1854  TROPONINI <0.03   BNP (last 3 results) No results for input(s): PROBNP in the last 8760 hours. HbA1C: No results for input(s): HGBA1C in the last 72 hours. CBG: No results for input(s): GLUCAP in the last 168 hours. Lipid Profile: No results for input(s): CHOL, HDL, LDLCALC, TRIG, CHOLHDL, LDLDIRECT in the last 72 hours. Thyroid Function  Tests: No results for input(s): TSH, T4TOTAL, FREET4, T3FREE, THYROIDAB in the last 72 hours. Anemia Panel: No results for input(s): VITAMINB12, FOLATE, FERRITIN, TIBC, IRON, RETICCTPCT in the last 72 hours. Urine analysis:    Component Value Date/Time   COLORURINE YELLOW 08/21/2016 1924   APPEARANCEUR CLEAR 08/21/2016 1924   LABSPEC 1.014 08/21/2016 1924   PHURINE 6.0 08/21/2016 1924   GLUCOSEU 150 (A) 08/21/2016 1924   HGBUR NEGATIVE 08/21/2016 1924   BILIRUBINUR NEGATIVE 08/21/2016 1924   KETONESUR NEGATIVE 08/21/2016 1924   PROTEINUR 30 (A) 08/21/2016 1924   UROBILINOGEN 0.2 06/19/2014 1648   NITRITE NEGATIVE 08/21/2016 1924   LEUKOCYTESUR TRACE (A) 08/21/2016 1924    Radiological Exams on Admission: Dg Chest 2 View  Result Date: 12/08/2017 CLINICAL DATA:  Central chest pain.  Shortness of breath. EXAM: CHEST - 2 VIEW COMPARISON:  August 03, 2017 FINDINGS: A tortuous prominent aorta, consistent with the patient's known aortic aneurysm is stable on today's study. The lungs are clear. The hila and mediastinum are unremarkable. No other acute abnormalities. IMPRESSION: The patient's known aneurysmal thoracic aorta is stable on today's x-ray compared to August 03, 2017. No interval change radiographically. Electronically Signed   By: Gerome Sam III M.D   On: 12/08/2017 20:05    EKG: Independently reviewed.  Vent. rate 82 BPM PR interval * ms QRS duration 96 ms QT/QTc 391/457 ms P-R-T axes 56 6 39 Sinus rhythm Probable left atrial enlargement Abnormal R-wave progression, early transition Borderline T wave abnormalities  Assessment/Plan Principal Problem:   Thoracic aortic dissection (HCC) Discussed with CTS. They recommended transfer to [facility] and blood pressure control. Earlier he received clonidine, amlodipine and hydralazine 20 mg IVP. He was started on a nitroglycerin infusion for better control. Analgesics and antiemetics as needed. Advised the patient to cease  smoking and be compliant with meds..  Active Problems:   Chest pain Due to above. Trend troponin levels.    Hypertension Resume amlodipine, clonidine, lisinopril and HCTZ. Monitor BP, HR, renal function electrolytes.    High cholesterol Given his history, should be started on a statin.    Tobacco abuse Nicotine replacement therapy ordered. Staff to provide tobacco cessation information.    Hypothyroidism TSH is elevated at 11.0 37. Resume levothyroxine 25 mcg p.o. daily. Follow-up TSH in 4 to 6 weeks.    Type 2 diabetes mellitus (HCC) Carbohydrate modified diet. CBG monitoring with regular insulin sliding  scale. Hold metformin for 48 hours due to receiving IV contrast.    Anxiety with depression No longer on fluoxetine.    DVT prophylaxis: SCDs. Code Status: Full code. Family Communication: None at bedside.  There was a low Engineer, manufacturing systems with him in the room. Disposition Plan: Admit to [facility] for BP control and CTS evaluation. Consults called: Cardiothoracic surgery (Dr. Lavinia Sharps). Admission status: Inpatient/stepdown.   Bobette Mo MD [facility] Pager [phone].  If 7PM-7AM, please contact night-coverage [email or web address] Password [email or web address]  12/08/2017, 10:12 PM

## 2017-12-08 NOTE — ED Notes (Signed)
Patient given meal tray.

## 2017-12-09 DIAGNOSIS — I10 Essential (primary) hypertension: Secondary | ICD-10-CM

## 2017-12-09 DIAGNOSIS — E119 Type 2 diabetes mellitus without complications: Secondary | ICD-10-CM

## 2017-12-09 DIAGNOSIS — E1169 Type 2 diabetes mellitus with other specified complication: Secondary | ICD-10-CM

## 2017-12-09 DIAGNOSIS — J449 Chronic obstructive pulmonary disease, unspecified: Secondary | ICD-10-CM

## 2017-12-09 DIAGNOSIS — Z720 Tobacco use: Secondary | ICD-10-CM

## 2017-12-09 DIAGNOSIS — I5032 Chronic diastolic (congestive) heart failure: Secondary | ICD-10-CM | POA: Diagnosis present

## 2017-12-09 DIAGNOSIS — E663 Overweight: Secondary | ICD-10-CM | POA: Diagnosis present

## 2017-12-09 DIAGNOSIS — E039 Hypothyroidism, unspecified: Secondary | ICD-10-CM

## 2017-12-09 DIAGNOSIS — I7101 Dissection of thoracic aorta: Principal | ICD-10-CM

## 2017-12-09 LAB — HEMOGLOBIN A1C
HEMOGLOBIN A1C: 5 % (ref 4.8–5.6)
MEAN PLASMA GLUCOSE: 96.8 mg/dL

## 2017-12-09 LAB — GLUCOSE, CAPILLARY
GLUCOSE-CAPILLARY: 108 mg/dL — AB (ref 70–99)
Glucose-Capillary: 149 mg/dL — ABNORMAL HIGH (ref 70–99)
Glucose-Capillary: 145 mg/dL — ABNORMAL HIGH (ref 70–99)
Glucose-Capillary: 124 mg/dL — ABNORMAL HIGH (ref 70–99)

## 2017-12-09 LAB — MRSA PCR SCREENING: MRSA BY PCR: NEGATIVE

## 2017-12-09 LAB — T4, FREE: Free T4: 0.76 ng/dL — ABNORMAL LOW (ref 0.82–1.77)

## 2017-12-09 LAB — TROPONIN I: TROPONIN I: 0.03 ng/mL — AB (ref <0.03)

## 2017-12-09 LAB — BRAIN NATRIURETIC PEPTIDE: B Natriuretic Peptide: 100 pg/mL (ref 0.0–100.0)

## 2017-12-09 MED ORDER — METOPROLOL SUCCINATE ER 25 MG PO TB24
25.0000 mg | ORAL_TABLET | Freq: Every day | ORAL | Status: DC
  Administered 2017-12-10: 25 mg via ORAL
  Filled 2017-12-09: qty 1

## 2017-12-09 MED ORDER — TAMSULOSIN HCL 0.4 MG PO CAPS
0.4000 mg | ORAL_CAPSULE | Freq: Every day | ORAL | Status: DC
  Administered 2017-12-09 – 2017-12-10 (×2): 0.4 mg via ORAL
  Filled 2017-12-09 (×2): qty 1

## 2017-12-09 MED ORDER — INSULIN ASPART 100 UNIT/ML ~~LOC~~ SOLN
0.0000 [IU] | Freq: Three times a day (TID) | SUBCUTANEOUS | Status: DC
  Administered 2017-12-09 (×2): 2 [IU] via SUBCUTANEOUS

## 2017-12-09 MED ORDER — HYDROCHLOROTHIAZIDE 25 MG PO TABS
25.0000 mg | ORAL_TABLET | Freq: Every day | ORAL | Status: DC
  Administered 2017-12-09 – 2017-12-10 (×2): 25 mg via ORAL
  Filled 2017-12-09 (×2): qty 1

## 2017-12-09 MED ORDER — LISINOPRIL 10 MG PO TABS
10.0000 mg | ORAL_TABLET | Freq: Every day | ORAL | Status: DC
  Administered 2017-12-09 – 2017-12-10 (×2): 10 mg via ORAL
  Filled 2017-12-09 (×2): qty 1

## 2017-12-09 MED ORDER — OXYCODONE-ACETAMINOPHEN 7.5-325 MG PO TABS
1.0000 | ORAL_TABLET | Freq: Three times a day (TID) | ORAL | Status: DC | PRN
  Administered 2017-12-09 – 2017-12-10 (×4): 1 via ORAL
  Filled 2017-12-09 (×4): qty 1

## 2017-12-09 MED ORDER — GABAPENTIN 400 MG PO CAPS
800.0000 mg | ORAL_CAPSULE | Freq: Two times a day (BID) | ORAL | Status: DC
  Administered 2017-12-09 – 2017-12-10 (×3): 800 mg via ORAL
  Filled 2017-12-09 (×3): qty 2

## 2017-12-09 MED ORDER — AMLODIPINE BESYLATE 10 MG PO TABS
10.0000 mg | ORAL_TABLET | Freq: Every day | ORAL | Status: DC
  Administered 2017-12-09 – 2017-12-10 (×2): 10 mg via ORAL
  Filled 2017-12-09 (×2): qty 1

## 2017-12-09 MED ORDER — AMLODIPINE BESYLATE 10 MG PO TABS: 20.0000 mg | ORAL_TABLET | Freq: Every day | ORAL | Status: DC

## 2017-12-09 MED ORDER — NITROGLYCERIN IN D5W 200-5 MCG/ML-% IV SOLN
0.0000 ug/min | INTRAVENOUS | Status: DC
  Administered 2017-12-09: 5 ug/min via INTRAVENOUS
  Filled 2017-12-09: qty 250

## 2017-12-09 MED ORDER — CLONIDINE HCL 0.1 MG PO TABS: 0.1000 mg | ORAL_TABLET | Freq: Three times a day (TID) | ORAL | Status: DC

## 2017-12-09 MED ORDER — LEVOTHYROXINE SODIUM 25 MCG PO TABS
25.0000 ug | ORAL_TABLET | Freq: Every day | ORAL | Status: DC
  Administered 2017-12-09 – 2017-12-10 (×2): 25 ug via ORAL
  Filled 2017-12-09 (×2): qty 1

## 2017-12-09 NOTE — Progress Notes (Signed)
Pt has decided to stay the night and wait to get discharged tomorrow.

## 2017-12-09 NOTE — Progress Notes (Addendum)
During shift change reporting in pt's room with outgoing nurse pt got upset and stated that he is not staying here any longer and is leaving the hospital tonight.  We explained to him the reason why he is here and that it's best that he stays but he stated that "my blood pressure is low and they keep giving me medicines and they can just shove it up their asses".  Charge nurse Judeth CornfieldStephanie was notified and she went to talk to pt as well. MD was also notified.  Pt plans on leaving AMA and planning on having his mom pick him up tonight.

## 2017-12-09 NOTE — Progress Notes (Signed)
PROGRESS NOTE  [name]:[id number] DOB: 15-Nov-1965 DOA: 12/08/2017 PCP: Patient, No Pcp Per  HPI/Recap of past 24 hours: Patient is [name] 52 year old male Trevor past medical history of diabetes mellitus, hepatitis C, hypertension and hypothyroidism, CAD status post MI and COPD plus diastolic heart failure Trevor presented from [facility] jail.  Patient states that even prior to jail, he had Trevor been taking his medication for blood pressure for [name] little while.  He was brought in for chest pain and at that time, found to have markedly elevated blood pressure at 214/118.  Oxygen saturations were 99% on room air.  Patient underwent [name] CT angiogram which noted thoracic aortic dissection distal to the right brachiocephalic artery and origin involving the left common carotid Trevor extension into the abdominal aorta and bilateral iliac arteries.  The true lumen was decompressed.  No evidence of rupture.  Previous studies were limited due to noncontrast.  Patient also noted to have signs of hepatic cirrhosis Trevor fatty infiltration of the liver.  Patient initially had presented at [facility] and was transferred to [facility] in the early morning of 11/22 for further evaluation after discussion Trevor cardiothoracic surgery.  His chest pain had resolved prior to transfer.  He was given medication for pain and blood pressure control.  Patient seen by cardiothoracic surgery later this morning.  Blood pressure is much improved Trevor systolic ranging from 120s-150s Trevor [name] heart rate between 70-90.  He denies any current chest pain.  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  Some previous scans did no evidence of dissection, but it appears he has had some new aortic dissection responsible for his current chest pain prompting this admission.  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  It was felt long-term, he would need repair of this aneurysm of the descending thoracic aorta Trevor stent graft.  However this could be done later on in the meantime get his blood pressure and other medical issues under control and also give time after the acute dissection to heal.  Patient would also require either an aortic arch replacement or deep branching prior to stent grafting.  After patient seen by cardiothoracic surgery, followed by hospitalist we had extensive discussion.  He states some of his medications ran out and that they were expensive.  He does seem to have [name] limited knowledge of his chronic medical issues.  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  Plan eventually will be for surgery, but in the meantime, need to better stabilize patient medically and give time for acute tear to heal.  Have started beta-blocker.  Echocardiogram in the morning. Active Problems:   Hypertension, uncontrolled Trevor acute urgency: Patient on Norvasc and clonidine.  Have stopped clonidine and started metoprolol.  Also on lisinopril and HCTZ.  See below in regards to heart failure.   High cholesterol: Fasting lipid panel in the morning.   Tobacco abuse: Nicotine patch.  Counseled to quit.   Chest pain:: Demand ischemia brought on by aneurysm.  Stable.  No acute myocardial infarction.  Chest pain-free now.  COPD: Stable during his hospitalization.  Incarceration: Had discussion Trevor prison officials.  They have decided to release patient and he is no longer under  their watch.  Guard stationed at room has been sent home.    Hypothyroidism: Patient Trevor on Synthroid.  TSH found to be elevated at 11.  Free T3 and free T4 ordered and are pending.  Started on Synthroid 25 p.o. daily   Type 2  diabetes mellitus (HCC): CBGs during his hospitalization have been stable and under 150.  A1c pending.    Anxiety Trevor depression   Overweight (BMI 25.0-29.9): Meets criteria Trevor BMI greater than 25.   Chronic diastolic CHF (congestive heart failure) [facility]): Patient had an echocardiogram done in 2016 which noted diastolic dysfunction.  On ACE inhibitor and beta-blocker.  Checking BNP.  BPH: Continue Flomax   Code Status: Full code  Family Communication: Patient initially Trevor allowed communication due to the fact that he is in jail.  However, Trevor he has been released, he is allowed communication  Disposition Plan: Potential discharge next 1-2 days once pressure heart rate stable and we have followed up on his thyroid studies, A1c, lipid panel and echocardiogram   Consultants:  Cardiothoracic surgery  Procedures:  None  Antimicrobials:  None  DVT prophylaxis: SCDs   Objective: Vitals:   12/09/17 1353 12/09/17 1612  BP: (!) 157/105 138/85  Pulse:  79  Resp:  18  Temp:  98 F (36.7 C)  SpO2:  97%    Intake/Output Summary (Last 24 hours) at 12/09/2017 1712 Last data filed at 12/09/2017 1300 Gross per 24 hour  Intake 1467.83 ml  Output 200 ml  Net 1267.83 ml   Filed Weights   12/09/17 0310  Weight: 85.3 kg   Body mass index is 29.45 kg/m.  Exam:   General: Alert and oriented x3, no acute distress  HEENT: Normocephalic and atraumatic, mucous memories are moist  Neck: Thick, narrow airway  Cardiovascular: Regular rate and rhythm, S1-S2  Respiratory: Decreased breath sounds throughout  Abdomen: Soft, mildly distended, nontender, hypoactive bowel sounds  Musculoskeletal: No clubbing or cyanosis, trace pitting edema  Skin: No skin breaks, tears or lesions  Neuro: No focal deficits  Psychiatry: Appropriate, no evidence of psychoses   Data Reviewed: CBC: Recent Labs  Lab 12/08/17 1854  WBC 8.7  HGB 14.7  HCT 44.0  MCV 83.5  PLT 199    Basic Metabolic Panel: Recent Labs  Lab 12/08/17 1854  NA 138  K 3.5  CL 101  CO2 27  GLUCOSE 93  BUN 19  CREATININE 1.10  CALCIUM 9.2  MG 2.0  PHOS 3.8   GFR: Estimated Creatinine Clearance: 81.1 mL/min (by C-G formula based on SCr of 1.1 mg/dL). Liver Function Tests: No results for input(s): AST, ALT, ALKPHOS, BILITOT, PROT, ALBUMIN in the last 168 hours. No results for input(s): LIPASE, AMYLASE in the last 168 hours. No results for input(s): AMMONIA in the last 168 hours. Coagulation Profile: No results for input(s): INR, PROTIME in the last 168 hours. Cardiac Enzymes: Recent Labs  Lab 12/08/17 1854 12/09/17 0109  TROPONINI <0.03 0.03*   BNP (last 3 results) No results for input(s): PROBNP in the last 8760 hours. HbA1C: No results for input(s): HGBA1C in the last 72 hours. CBG: Recent Labs  Lab 12/09/17 0610 12/09/17 1112 12/09/17 1614  GLUCAP 108* 149* 145*   Lipid Profile: No results for input(s): CHOL, HDL, LDLCALC, TRIG, CHOLHDL, LDLDIRECT in the last 72 hours. Thyroid Function Tests: Recent Labs    12/08/17 1854  TSH 11.037*   Anemia Panel: No results for input(s): VITAMINB12, FOLATE, FERRITIN, TIBC, IRON, RETICCTPCT in  the last 72 hours. Urine analysis:    Component Value Date/Time   COLORURINE STRAW ([name]) 12/08/2017 2150   APPEARANCEUR CLEAR 12/08/2017 2150   LABSPEC 1.017 12/08/2017 2150   PHURINE 7.0 12/08/2017 2150   GLUCOSEU NEGATIVE 12/08/2017 2150   HGBUR NEGATIVE 12/08/2017 2150   BILIRUBINUR NEGATIVE 12/08/2017 2150   KETONESUR NEGATIVE 12/08/2017 2150   PROTEINUR NEGATIVE 12/08/2017 2150   UROBILINOGEN 0.2 06/19/2014 1648   NITRITE NEGATIVE 12/08/2017 2150   LEUKOCYTESUR NEGATIVE 12/08/2017 2150   Sepsis Labs: @LABRCNTIP (procalcitonin:4,lacticidven:4)  ) Recent Results (from the past 240 hour(s))  MRSA PCR Screening     Status: None   Collection Time: 12/09/17  3:21 AM  Result Value Ref Range Status   MRSA by PCR  NEGATIVE NEGATIVE Final    Comment:        The GeneXpert MRSA Assay (FDA approved for NASAL specimens only), is one component of [name] comprehensive MRSA colonization surveillance program. It is Trevor intended to diagnose MRSA infection nor to guide or monitor treatment for MRSA infections. Performed at [facility], [address] [place], [place] [address]       Studies: Dg Chest 2 View  Result Date: 12/08/2017 CLINICAL DATA:  Central chest pain.  Shortness of breath. EXAM: CHEST - 2 VIEW COMPARISON:  August 03, 2017 FINDINGS: [name] tortuous prominent aorta, consistent Trevor the patient's known aortic aneurysm is stable on today's study. The lungs are clear. The hila and mediastinum are unremarkable. No other acute abnormalities. IMPRESSION: The patient's known aneurysmal thoracic aorta is stable on today's x-ray compared to August 03, 2017. No interval change radiographically. Electronically Signed   By: Gerome Samavid  Williams III M.D   On: 12/08/2017 20:05   Ct Angio Chest/abd/pel For Dissection W And/or W/wo  Result Date: 12/08/2017 CLINICAL DATA:  Central chest pain Trevor elevated blood pressure. History of abdominal aortic aneurysm. EXAM: CT ANGIOGRAPHY CHEST, ABDOMEN AND PELVIS TECHNIQUE: Multidetector CT imaging through the chest, abdomen and pelvis was performed using the standard protocol during bolus administration of intravenous contrast. Multiplanar reconstructed images and MIPs were obtained and reviewed to evaluate the vascular anatomy. CONTRAST:  100mL ISOVUE-370 IOPAMIDOL (ISOVUE-370) INJECTION 76% COMPARISON:  09/20/2017 FINDINGS: CTA CHEST FINDINGS Cardiovascular: Noncontrast CT images of the chest demonstrate diffuse dilatation of the descending thoracic aorta. No evidence of intramural hematoma. Coronary artery calcifications. Images obtained after contrast administration during the arterial phase demonstrate thoracic aortic dissection beginning at the level of the great vessels and  involving the left common carotid origin, extending through the arch and into the descending aorta. Dissection extends throughout the descending aorta to the abdomen and below. All of the most recent comparison studies have been done without contrast material so comparison is difficult. The most recent contrast enhanced CT chest is from 06/09/2010 in which there was Trevor [name] dissection. However, on the most recent study of 09/20/2017, the descending aorta was dilated and there were displaced intimal calcifications in the abdominal and iliac portions, suggesting that this was present at that time. I believe the true lumen is anterior in the descending aorta and is decompressed Trevor significant flow in the false lumen. The dissection extends into the origin of the left common carotid artery. The left subclavian artery appears to arise from the true lumen. No thrombosis. Descending thoracic aortic diameter measures about 4.5 cm, similar to previous study. Normal heart size. No pericardial effusion. Coronary artery calcifications. Great vessel origins are patent. Central pulmonary arteries are patent without evidence  of large central pulmonary embolus. Mediastinum/Nodes: No significant lymphadenopathy in the chest. Esophagus is decompressed. No abnormal mediastinal hematoma or fluid collection. Lungs/Pleura: Lungs are clear and expanded. No pleural effusions. No pneumothorax. Airways are patent. Musculoskeletal: Prominent degenerative changes throughout the thoracic spine. No destructive bone lesions. Review of the MIP images confirms the above findings. CTA ABDOMEN AND PELVIS FINDINGS VASCULAR Aorta: Aortic dissection extends into the abdominal aorta down to the aortic bifurcation and below. Celiac: Celiac axis arises from the decompressed true lumen. Celiac axis is patent. SMA: Superior mesenteric artery originates from the decompressed true lumen and is patent although there is evidence of mild thrombosis at the origin.  Renals: Single bilateral renal arteries arise from the true lumen and are patent. IMA: Inferior mesenteric artery arises from the true lumen and is patent. Inflow: Dissection extends into both right and left iliac arteries, terminating in the left iliac artery Trevor thrombosis. The left iliac artery arises from the true lumen. Dissection extends into the right iliac artery to the level of the bifurcation Trevor internal and external iliac arteries probably arising from the true lumen. Inflow vessels are patent. There is [name] femoral to femoral bypass graft which appears patent. Common femoral dilatation at the insertion site of the bypass graft. Veins: No obvious venous abnormality within the limitations of this arterial phase study. Review of the MIP images confirms the above findings. NON-VASCULAR Hepatobiliary: Probable [name] [place] cirrhosis Trevor enlarged lateral segment left lobe of liver and mild nodular contour. Diffuse fatty infiltration of the liver. No focal lesions. Gallbladder and bile ducts are unremarkable. Pancreas: Unremarkable. No pancreatic ductal dilatation or surrounding inflammatory changes. Spleen: Normal in size without focal abnormality. Adrenals/Urinary Tract: No adrenal gland nodules. Renal nephrograms are symmetrical. No hydronephrosis or hydroureter. Bladder is unremarkable. Stomach/Bowel: Stomach, small bowel, and colon are Trevor abnormally distended. Diffusely stool-filled colon. No wall thickening or inflammatory changes. Appendix is normal. Lymphatic: No significant lymphadenopathy. Reproductive: Prostate gland is Trevor enlarged. Prostate calcifications are present. Other: No free air or free fluid in the abdomen. No retroperitoneal hematomas. Musculoskeletal: Degenerative changes throughout the spine. Postoperative laminectomy and posterior fixation from L4 to the sacrum. Intervertebral disc prosthesis at L5-S1. Degenerative changes in the hips. Review of the MIP images confirms the above  findings. IMPRESSION: 1. Thoracic aortic dissection beginning just distal to the right brachiocephalic artery origin and involving the left common carotid artery Trevor extension into the abdominal aorta and bilateral iliac arteries. True lumen is decompressed. The celiac axis, superior mesenteric artery, bilateral renal arteries, iliac arteries and inferior mesenteric artery appear to arise from the true lumen. No evidence of rupture. Correlation Trevor most recent previous study is limited because that study was noncontrast, but this was probably present previously since the aorta was dilated at that time and displaced intimal calcifications could be seen in the abdomen. 2. Probable hepatic cirrhosis.  Fatty infiltration of the liver. 3. Femoral bypass graft appears patent. These results were called by telephone at the time of interpretation on 12/08/2017 at 10:53 pm to Dr. Sanda Klein , Trevor verbally acknowledged these results. Electronically Signed   By: Burman Nieves M.D.   On: 12/08/2017 23:04    Scheduled Meds: . amLODipine  10 mg Oral Daily  . cloNIDine  0.1 mg Oral TID  . gabapentin  800 mg Oral BID  . hydrochlorothiazide  25 mg Oral Daily  . insulin aspart  0-15 Units Subcutaneous TID WC  . levothyroxine  25 mcg Oral Q0600  .  lisinopril  10 mg Oral Daily  . nicotine  21 mg Transdermal Daily  . tamsulosin  0.4 mg Oral Daily    Continuous Infusions: . sodium chloride 150 mL/hr at 12/09/17 1231  . nitroGLYCERIN 25 mcg/min (12/09/17 0503)     LOS: 1 day     Hollice Espy, MD [facility]  To reach me or the doctor on call, go to: [email or web address] Password [facility]  12/09/2017, 5:12 PM

## 2017-12-09 NOTE — Progress Notes (Signed)
Received report from ED nurse at [facility] about [facility] coming to [id number] there to transport patient. Victorino DecemberGarnet A Keavon Sensing, RN

## 2017-12-09 NOTE — Consult Note (Signed)
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 lumen. No evidence of rupture. Correlation with most recent previous study is limited because that study was noncontrast, but this was probably present previously  since the aorta was dilated at that time and displaced intimal calcifications could be seen in the abdomen. 2. Probable hepatic cirrhosis.  Fatty infiltration of the liver. 3. Femoral bypass graft appears patent.  These results were called by telephone at the time of interpretation on 12/08/2017 at 10:53 pm to Dr. Tennis Must , who verbally acknowledged these results.   Electronically Signed   By: Lucienne Capers M.D.   On: 12/08/2017 23:04  Assessment/Plan:  This 52 year old gentleman with severe hypertension presented with a hypertensive emergency with a systolic blood pressure over 200 and chest pain is  been present for a few days.  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  The proximal extent of this appears to begin just distal to the origin of a bovine trunk that divides into the innominate and left common carotid artery.  There may be some extension of the dissection into the origin of the left common carotid but is difficult to tell.  This may also be a small pseudoaneurysm or penetrating ulcer.  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  He has had prior CT scans of the chest and abdomen but none of them or with contrast.  There is evidence of prior aortic dissection with displacement of calcium into the lumen on CT scans that I reviewed from September 2019 as well as May 2019.  A CT scan from 2016 showed fairly normal aortic size.  Review of care everywhere also mentions evidence of aortic dissection within the thoracic and abdominal aorta on prior CT scan at [facility] when he was being worked up prior to his femorofemoral bypass.  He may have had some new aortic dissection responsible for his current chest pain prompting this admission.  There is some motion artifact along the ascending aorta on his current study but there is no evidence of aortic dissection.  The ascending aorta has a maximum diameter of 4 cm.  There is also no hematoma or fluid around the thoracic or abdominal aorta which I would expect with an acute aortic dissection.  He is currently chest pain-free and I think good blood pressure control should be the goal at this time.  He does have aneurysmal enlargement of the entire descending thoracic aorta with a maximum diameter of 5 cm.  Long-term I think he is going to require repair of this aneurysmal descending thoracic aorta with a stent graft.  Since he is  stable I think it is probably best to control his blood pressure and get him out a ways from the acute event to allow any acute dissection to heal before considering stent graft placement.  He would require either aortic arch replacement or deep branching prior to stent grafting.  I discussed all this with him but he has very little insight into his health care problems and I am not sure that he would even agree to surgical treatment given his experiences with his prior surgeries.  I reviewed the CT images and discussed the case with Dr. Trula Slade and we will continue to follow the patient and make plans for follow-up after he leaves the hospital.  I will order a 2D echocardiogram while he is here to assess his left ventricular function and valve function.  I spent 60 minutes performing this consultation  and > 50% of this time was spent face to face counseling and coordinating the care of this patient's thoracic aortic aneurysm and aortic dissection.  Gaye Pollack 12/09/2017, 8:45 AM

## 2017-12-09 NOTE — Progress Notes (Signed)
Patient arrived from [facility] via [facility] to [id number]. CHG done; drip parameters verified,initial assessment completed; VSS. Patient oriented to room; call bell within reach and bed in lowest position. Since patient from correctional facility, a guard is at the bedside. Patient placed on telemetry. Will continue to monitor. Victorino DecemberGarnet A Kirsten Spearing, RN

## 2017-12-10 LAB — CBC
HEMATOCRIT: 35.5 % — AB (ref 39.0–52.0)
Hemoglobin: 11.9 g/dL — ABNORMAL LOW (ref 13.0–17.0)
MCH: 27.1 pg (ref 26.0–34.0)
MCHC: 33.5 g/dL (ref 30.0–36.0)
MCV: 80.9 fL (ref 80.0–100.0)
NRBC: 0 % (ref 0.0–0.2)
Platelets: 169 10*3/uL (ref 150–400)
RBC: 4.39 MIL/uL (ref 4.22–5.81)
RDW: 13.5 % (ref 11.5–15.5)
WBC: 10.2 10*3/uL (ref 4.0–10.5)

## 2017-12-10 LAB — BASIC METABOLIC PANEL
Anion gap: 10 (ref 5–15)
BUN: 21 mg/dL — AB (ref 6–20)
CO2: 19 mmol/L — AB (ref 22–32)
CREATININE: 1.35 mg/dL — AB (ref 0.61–1.24)
Calcium: 8.5 mg/dL — ABNORMAL LOW (ref 8.9–10.3)
Chloride: 102 mmol/L (ref 98–111)
GFR calc Af Amer: >60 mL/min (ref >60)
GFR, EST NON AFRICAN AMERICAN: 58 mL/min — AB (ref >60)
GLUCOSE: 116 mg/dL — AB (ref 70–99)
Potassium: 3.6 mmol/L (ref 3.5–5.1)
Sodium: 131 mmol/L — ABNORMAL LOW (ref 135–145)

## 2017-12-10 LAB — LIPID PANEL
Cholesterol: 124 mg/dL (ref 0–200)
HDL: 31 mg/dL — ABNORMAL LOW (ref >40)
LDL Cholesterol: 81 mg/dL (ref 0–99)
TRIGLYCERIDES: 61 mg/dL (ref <150)
Total CHOL/HDL Ratio: 4 RATIO
VLDL: 12 mg/dL (ref 0–40)

## 2017-12-10 LAB — GLUCOSE, CAPILLARY: Glucose-Capillary: 101 mg/dL — ABNORMAL HIGH (ref 70–99)

## 2017-12-10 LAB — TROPONIN I: Troponin I: <0.03 ng/mL (ref <0.03)

## 2017-12-10 NOTE — Progress Notes (Addendum)
Pt's IV was beeping. Upon entry into the room pt stated that he would like to turn off the IV because it has alarmed several times tonight.  He is in pain and with the IV going off his "nerves can not handle it".  He also state that I turn it off or else I will not have a machine the next time I enter the room.  Pt's BP has been well below 130 SBP through the night.  Pt's nitrogen drip has been turn off and will continue to monitor pt closely.

## 2017-12-12 LAB — T3, FREE: T3, Free: 3 pg/mL (ref 2.0–4.4)

## 2018-01-03 NOTE — Discharge Summary (Signed)
D          Discharge Summary  Roxanna MewRobert L Malinak ZOX:[id number] DOB: 02/26/1965  PCP: Patient, No Pcp Per  Admit date: 12/08/2017 Discharge date: 12/09/2017    Recommendations for Outpatient Follow-up:  1. I advised patient earlier when I first examined him that he would need to establish with a primary care doctor and get his blood pressure under control.  I hope that he is able to do this.  Discharge Diagnoses:  Active Hospital Problems   Diagnosis Date Noted  . Thoracic aortic dissection (HCC) 12/08/2017  . Overweight (BMI 25.0-29.9) 12/09/2017  . Chronic diastolic CHF (congestive heart failure) (HCC) 12/09/2017  . Chest pain 12/08/2017  . Hypothyroidism 12/08/2017  . Type 2 diabetes mellitus (HCC) 12/08/2017  . Anxiety with depression 12/08/2017  . Tobacco abuse 08/21/2016  . Hypertension   . High cholesterol     Resolved Hospital Problems  No resolved problems to display.    Discharge Condition: Stable, the patient left without his medications  Diet recommendation: Heart healthy  Vitals:   12/10/17 0424 12/10/17 0910  BP: 121/81 136/81  Pulse: 66 85  Resp: 15   Temp:    SpO2: 95%     History of present illness:  Patient is a 52 year old male with past medical history of diabetes mellitus, hepatitis C, hypertension and hypothyroidism, CAD status post MI and COPD plus diastolic heart failure [facility]  Patient states that even prior to jail, he had not been taking his medication for blood pressure for a little while.  He was brought in for chest pain and at that time, found to have markedly elevated blood pressure at 214/118.  Oxygen saturations were 99% on room air.  Patient underwent a CT angiogram which noted thoracic aortic dissection distal to the right brachiocephalic artery and origin involving the left common carotid with extension into the abdominal aorta and bilateral iliac arteries.  The true lumen was decompressed.  No  evidence of rupture.  Previous studies were limited due to noncontrast.  Patient also noted to have signs of hepatic cirrhosis with fatty infiltration of the liver.  Patient [facility] had [facility] and was transferred to [facility] in the early morning of 11/22 for further evaluation after discussion with cardiothoracic surgery.  His chest pain had resolved prior to transfer.  He was given medication for pain and blood pressure control.  Patient seen by cardiothoracic surgery later this morning.  Blood pressure is much improved with systolic ranging from 120s-150s with a heart rate between 70-90.  He denies any current chest pain.  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  Some previous scans did no evidence of dissection, but it appears he has had some new aortic dissection responsible for his current chest pain prompting this admission.  He is noted to have aneurysmal enlargement of the entire descending thoracic aorta with a maximum diameter of 5 cm.  It was felt long-term, he would need repair of this aneurysm of the descending thoracic aorta with stent graft.  However this could be done later on in the meantime get his blood pressure and other medical issues under control and also give time after the acute dissection to heal.  Patient would also require either an aortic arch replacement or deep branching prior to stent grafting.  After patient seen  by cardiothoracic surgery, followed by hospitalist we had extensive discussion.  He states some of his medications ran out and that they were expensive.  He does seem to have a limited knowledge of his chronic medical issues.  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  Plan  eventually will be for surgery, but in the meantime, need to better stabilize patient medically and give time for acute tear to heal.  Have started beta-blocker.  Echocardiogram in the morning. Active Problems:   Hypertension, uncontrolled with acute urgency: Patient on Norvasc and clonidine.  Have stopped clonidine and started metoprolol.  Also on lisinopril and HCTZ.  See below in regards to heart failure.   High cholesterol: Fasting lipid panel in the morning.   Tobacco abuse: Nicotine patch.  Counseled to quit.   Chest pain:: Demand ischemia brought on by aneurysm.  Stable.  No acute myocardial infarction.  Chest pain-free now.  COPD: Stable during his hospitalization.  Incarceration: Had discussion with prison officials.  They have decided to release patient and he is no longer under their watch.  Guard stationed at room has been sent home.    Hypothyroidism: Patient not on Synthroid.  TSH found to be elevated at 11.  Free T3 and free T4 ordered and are pending.  Started on Synthroid 25 p.o. daily   Type 2 diabetes mellitus (HCC): CBGs during his hospitalization have been stable and under 150.  A1c pending.    Anxiety with depression   Overweight (BMI 25.0-29.9): Meets criteria with BMI greater than 25.   Chronic diastolic CHF (congestive heart failure) [facility]): Patient had an echocardiogram done in 2016 which noted diastolic dysfunction.  On ACE inhibitor and beta-blocker.  Checking BNP.  BPH: Continue Flomax  Later that afternoon of 11/22, received a call from [facility] that they were deciding to release the prisoner.  When they notified the patient, he was no longer any type of guard watch and the guard was dismissed.  That night, patient stated that he was going to leave AMA.  He did not wait for any prescriptions or wait to be discharged.  Consultants:  Cardiothoracic surgery  Procedures:  None  Discharge Exam: BP 136/81   Pulse 85   Temp 97.9 F (36.6 C)  (Oral)   Resp 15   Ht 5\' 7"  (1.702 m)   Wt 85.3 kg   SpO2 95%   BMI 29.45 kg/m    Discharge Instructions You were cared for by a hospitalist during your hospital stay. If you have any questions about your discharge medications or the care you received while you were in the hospital after you are discharged, you can call the unit and asked to speak with the hospitalist on call if the hospitalist that took care of you is not available. Once you are discharged, your primary care physician will handle any further medical issues. Please note that NO REFILLS for any discharge medications will be authorized once you are discharged, as it is imperative that you return to your primary care physician (or establish a relationship with a primary care physician if you do not have one) for your aftercare needs so that they can reassess your need for medications and monitor your lab values.   Allergies as of 12/10/2017      Reactions   Ivp Dye [iodinated Diagnostic Agents] Other (See Comments)   Kidney Failure - Per [facility]  Medication List    ASK your doctor about these medications   amLODipine 10 MG tablet Commonly known as:  NORVASC Take 2 tablets by mouth daily.   B-12 PO Take 1 tablet by mouth daily.   cloNIDine 0.1 MG tablet Commonly known as:  CATAPRES Take 0.1 mg by mouth 3 (three) times daily.   gabapentin 800 MG tablet Commonly known as:  NEURONTIN Take 800 mg by mouth 2 (two) times daily.   hydrochlorothiazide 25 MG tablet Commonly known as:  HYDRODIURIL Take 25 mg by mouth daily.   lisinopril 20 MG tablet Commonly known as:  PRINIVIL,ZESTRIL Take 10 mg by mouth daily.   metFORMIN 500 MG 24 hr tablet Commonly known as:  GLUCOPHAGE-XR Take 500 mg by mouth every evening.   methylphenidate 10 MG tablet Commonly known as:  RITALIN Take 10 mg by mouth 3 (three) times daily with meals.   metoprolol succinate 25 MG 24 hr tablet Commonly known as:  TOPROL-XL Take 12.5 mg  by mouth daily.   naproxen sodium 220 MG tablet Commonly known as:  ALEVE Take 220 mg by mouth 2 (two) times daily as needed.   oxyCODONE-acetaminophen 7.5-325 MG tablet Commonly known as:  PERCOCET Take 1 tablet by mouth 3 (three) times daily as needed for moderate pain or severe pain.   STIOLTO RESPIMAT 2.5-2.5 MCG/ACT Aers Generic drug:  Tiotropium Bromide-Olodaterol Inhale 1 puff into the lungs 2 (two) times daily as needed (for shortness of breath).   tamsulosin 0.4 MG Caps capsule Commonly known as:  FLOMAX Take 0.4 mg by mouth daily.      Allergies  Allergen Reactions  . Ivp Dye [Iodinated Diagnostic Agents] Other (See Comments)    Kidney Failure - Per [facility]       The results of significant diagnostics from this hospitalization (including imaging, microbiology, ancillary and laboratory) are listed below for reference.    Significant Diagnostic Studies: Dg Chest 2 View  Result Date: 12/08/2017 CLINICAL DATA:  Central chest pain.  Shortness of breath. EXAM: CHEST - 2 VIEW COMPARISON:  August 03, 2017 FINDINGS: A tortuous prominent aorta, consistent with the patient's known aortic aneurysm is stable on today's study. The lungs are clear. The hila and mediastinum are unremarkable. No other acute abnormalities. IMPRESSION: The patient's known aneurysmal thoracic aorta is stable on today's x-ray compared to August 03, 2017. No interval change radiographically. Electronically Signed   By: Gerome Sam III M.D   On: 12/08/2017 20:05   Ct Angio Chest/abd/pel For Dissection W And/or W/wo  Result Date: 12/08/2017 CLINICAL DATA:  Central chest pain with elevated blood pressure. History of abdominal aortic aneurysm. EXAM: CT ANGIOGRAPHY CHEST, ABDOMEN AND PELVIS TECHNIQUE: Multidetector CT imaging through the chest, abdomen and pelvis was performed using the standard protocol during bolus administration of intravenous contrast. Multiplanar reconstructed images and MIPs were obtained  and reviewed to evaluate the vascular anatomy. CONTRAST:  ISOVUE-370 IOPAMIDOL (ISOVUE-370) INJECTION 76% COMPARISON:  09/20/2017 FINDINGS: CTA CHEST FINDINGS Cardiovascular: Noncontrast CT images of the chest demonstrate diffuse dilatation of the descending thoracic aorta. No evidence of intramural hematoma. Coronary artery calcifications. Images obtained after contrast administration during the arterial phase demonstrate thoracic aortic dissection beginning at the level of the great vessels and involving the left common carotid origin, extending through the arch and into the descending aorta. Dissection extends throughout the descending aorta to the abdomen and below. All of the most recent comparison studies have been done without contrast material so comparison is  difficult. The most recent contrast enhanced CT chest is from 06/09/2010 in which there was not a dissection. However, on the most recent study of 09/20/2017, the descending aorta was dilated and there were displaced intimal calcifications in the abdominal and iliac portions, suggesting that this was present at that time. I believe the true lumen is anterior in the descending aorta and is decompressed with significant flow in the false lumen. The dissection extends into the origin of the left common carotid artery. The left subclavian artery appears to arise from the true lumen. No thrombosis. Descending thoracic aortic diameter measures about 4.5 cm, similar to previous study. Normal heart size. No pericardial effusion. Coronary artery calcifications. Great vessel origins are patent. Central pulmonary arteries are patent without evidence of large central pulmonary embolus. Mediastinum/Nodes: No significant lymphadenopathy in the chest. Esophagus is decompressed. No abnormal mediastinal hematoma or fluid collection. Lungs/Pleura: Lungs are clear and expanded. No pleural effusions. No pneumothorax. Airways are patent. Musculoskeletal: Prominent  degenerative changes throughout the thoracic spine. No destructive bone lesions. Review of the MIP images confirms the above findings. CTA ABDOMEN AND PELVIS FINDINGS VASCULAR Aorta: Aortic dissection extends into the abdominal aorta down to the aortic bifurcation and below. Celiac: Celiac axis arises from the decompressed true lumen. Celiac axis is patent. SMA: Superior mesenteric artery originates from the decompressed true lumen and is patent although there is evidence of mild thrombosis at the origin. Renals: Single bilateral renal arteries arise from the true lumen and are patent. IMA: Inferior mesenteric artery arises from the true lumen and is patent. Inflow: Dissection extends into both right and left iliac arteries, terminating in the left iliac artery with thrombosis. The left iliac artery arises from the true lumen. Dissection extends into the right iliac artery to the level of the bifurcation with internal and external iliac arteries probably arising from the true lumen. Inflow vessels are patent. There is a femoral to femoral bypass graft which appears patent. Common femoral dilatation at the insertion site of the bypass graft. Veins: No obvious venous abnormality within the limitations of this arterial phase study. Review of the MIP images confirms the above findings. 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 Diffuse fatty infiltration of the liver. No focal lesions. Gallbladder and bile ducts are unremarkable. Pancreas: Unremarkable. No pancreatic ductal dilatation or surrounding inflammatory changes. Spleen: Normal in size without focal abnormality. Adrenals/Urinary Tract: No adrenal gland nodules. Renal nephrograms are symmetrical. No hydronephrosis or hydroureter. Bladder is unremarkable. Stomach/Bowel: Stomach, small bowel, and colon are not abnormally distended. Diffusely stool-filled colon. No wall  thickening or inflammatory changes. Appendix is normal. Lymphatic: No significant lymphadenopathy. Reproductive: Prostate gland is not enlarged. Prostate calcifications are present. Other: No free air or free fluid in the abdomen. No retroperitoneal hematomas. Musculoskeletal: Degenerative changes throughout the spine. Postoperative laminectomy and posterior fixation from L4 to the sacrum. Intervertebral disc prosthesis at L5-S1. Degenerative changes in the hips. Review of the MIP images confirms the above findings. IMPRESSION: 1. Thoracic aortic dissection beginning just distal to the right brachiocephalic artery origin and involving the left common carotid artery with extension into the abdominal aorta and bilateral iliac arteries. True lumen is decompressed. The celiac axis, superior mesenteric artery, bilateral renal arteries, iliac arteries and inferior mesenteric artery appear to arise from the true lumen. No evidence of rupture. Correlation with most recent previous study is limited because that study was noncontrast, but this was probably  present previously since the aorta was dilated at that time and displaced intimal calcifications could be seen in the abdomen. 2. Probable hepatic cirrhosis.  Fatty infiltration of the liver. 3. Femoral bypass graft appears patent. These results were called by telephone at the time of interpretation on 12/08/2017 at 10:53 pm to Dr. Sanda Klein , who verbally acknowledged these results. Electronically Signed   By: Burman Nieves M.D.   On: 12/08/2017 23:04    Microbiology: No results found for this or any previous visit (from the past 240 hour(s)).   Labs: Basic Metabolic Panel: No results for input(s): NA, K, CL, CO2, GLUCOSE, BUN, CREATININE, CALCIUM, MG, PHOS in the last 168 hours. Liver Function Tests: No results for input(s): AST, ALT, ALKPHOS, BILITOT, PROT, ALBUMIN in the last 168 hours. No results for input(s): LIPASE, AMYLASE in the last 168 hours. No  results for input(s): AMMONIA in the last 168 hours. CBC: No results for input(s): WBC, NEUTROABS, HGB, HCT, MCV, PLT in the last 168 hours. Cardiac Enzymes: No results for input(s): CKTOTAL, CKMB, CKMBINDEX, TROPONINI in the last 168 hours. BNP: BNP (last 3 results) Recent Labs    12/09/17 1926  BNP 100.0    ProBNP (last 3 results) No results for input(s): PROBNP in the last 8760 hours.  CBG: No results for input(s): GLUCAP in the last 168 hours.     Signed:  Hollice Espy, MD [facility] 01/03/2018, 3:39 PM

## 2018-08-19 DEATH — deceased
